# Patient Record
Sex: Female | Born: 1935 | Race: White | Hispanic: No | Smoking: Former smoker
Health system: Southern US, Community
[De-identification: ages and names within clinical notes are randomized; demographics above are authoritative.]

## PROBLEM LIST (undated history)

## (undated) DIAGNOSIS — E039 Hypothyroidism, unspecified: Secondary | ICD-10-CM

## (undated) DIAGNOSIS — K5790 Diverticulosis of intestine, part unspecified, without perforation or abscess without bleeding: Secondary | ICD-10-CM

## (undated) DIAGNOSIS — M1990 Unspecified osteoarthritis, unspecified site: Secondary | ICD-10-CM

## (undated) DIAGNOSIS — T4145XA Adverse effect of unspecified anesthetic, initial encounter: Secondary | ICD-10-CM

## (undated) DIAGNOSIS — H01003 Unspecified blepharitis right eye, unspecified eyelid: Secondary | ICD-10-CM

## (undated) DIAGNOSIS — R51 Headache: Secondary | ICD-10-CM

## (undated) DIAGNOSIS — H01006 Unspecified blepharitis left eye, unspecified eyelid: Secondary | ICD-10-CM

## (undated) DIAGNOSIS — F329 Major depressive disorder, single episode, unspecified: Secondary | ICD-10-CM

## (undated) DIAGNOSIS — G43909 Migraine, unspecified, not intractable, without status migrainosus: Secondary | ICD-10-CM

## (undated) DIAGNOSIS — F32A Depression, unspecified: Secondary | ICD-10-CM

## (undated) DIAGNOSIS — I10 Essential (primary) hypertension: Secondary | ICD-10-CM

## (undated) DIAGNOSIS — K219 Gastro-esophageal reflux disease without esophagitis: Secondary | ICD-10-CM

## (undated) DIAGNOSIS — R519 Headache, unspecified: Secondary | ICD-10-CM

## (undated) DIAGNOSIS — T8859XA Other complications of anesthesia, initial encounter: Secondary | ICD-10-CM

## (undated) HISTORY — PX: CATARACT EXTRACTION: SUR2

## (undated) HISTORY — PX: ANKLE SURGERY: SHX546

## (undated) HISTORY — PX: FOOT SURGERY: SHX648

## (undated) HISTORY — PX: SHOULDER SURGERY: SHX246

## (undated) HISTORY — PX: COLONOSCOPY: SHX174

## (undated) HISTORY — PX: CHOLECYSTECTOMY: SHX55

## (undated) HISTORY — PX: TOTAL KNEE ARTHROPLASTY: SHX125

## (undated) HISTORY — PX: BREAST SURGERY: SHX581

## (undated) HISTORY — PX: TONSILLECTOMY: SUR1361

## (undated) HISTORY — PX: OTHER SURGICAL HISTORY: SHX169

## (undated) HISTORY — PX: ABDOMINAL HYSTERECTOMY: SHX81

---

## 2001-10-20 ENCOUNTER — Other Ambulatory Visit: Admission: RE | Admit: 2001-10-20 | Discharge: 2001-10-20 | Payer: Self-pay | Admitting: Obstetrics & Gynecology

## 2002-05-24 ENCOUNTER — Encounter: Payer: Self-pay | Admitting: Orthopaedic Surgery

## 2002-05-24 ENCOUNTER — Ambulatory Visit (HOSPITAL_COMMUNITY): Admission: RE | Admit: 2002-05-24 | Discharge: 2002-05-24 | Payer: Self-pay | Admitting: Orthopaedic Surgery

## 2016-04-23 ENCOUNTER — Other Ambulatory Visit: Payer: Self-pay | Admitting: Orthopedic Surgery

## 2016-05-16 NOTE — Pre-Procedure Instructions (Signed)
Uchechi Metoyer  05/16/2016      [facility] [place] - [address] [place] [address] Phone: [phone] Fax: [phone]    Your procedure is scheduled on Monday,June 26   [facility] Admitting at 625 A.M.   Call this number if you have problems the morning of surgery:  [phone]   Remember:  Do not eat food or drink liquids after midnight.   Take these medicines the morning of surgery with A SIP OF WATER Bupropion (Wellbutrin XL), escitalopram (Lexapro), est estrogens-methyltest HS, Oxycodone (Percocet) if needed, Propranolol (Inderal), surcralfate (Carafate), Synthroid  7 days prior to surgery STOP taking (June 19):  any Aspirin, Aleve, Naproxen, Ibuprofen, Motrin, Advil, Goody's, BC's, all herbal medications, fish oil, and all vitamins    Do not wear jewelry, make-up or nail polish.  Do not wear lotions, powders, or perfumes.  You may wear deoderant.  Do not shave 48 hours prior to surgery.  Men may shave face and neck.  Do not bring valuables to the hospital.  [facility] is not responsible for any belongings or valuables.  Contacts, dentures or bridgework may not be worn into surgery.  Leave your suitcase in the car.  After surgery it may be brought to your room.  For patients admitted to the hospital, discharge time will be determined by your treatment team.  Patients discharged the day of surgery will not be allowed to drive home.    Special instructions:  [facility] - Preparing for Surgery  Before surgery, you can play an important role.  Because skin is not sterile, your skin needs to be as free of germs as possible.  You can reduce the number of germs on you skin by washing with CHG (chlorahexidine gluconate) soap before surgery.  CHG is an antiseptic cleaner which kills germs and bonds with the skin to continue killing germs even after washing.  Please DO NOT use if you have an allergy to CHG or  antibacterial soaps.  If your skin becomes reddened/irritated stop using the CHG and inform your nurse when you arrive at Short Stay.  Do not shave (including legs and underarms) for at least 48 hours prior to the first CHG shower.  You may shave your face.  Please follow these instructions carefully:   1.  Shower with CHG Soap the night before surgery and the                                morning of Surgery.  2.  If you choose to wash your hair, wash your hair first as usual with your       normal shampoo.  3.  After you shampoo, rinse your hair and body thoroughly to remove the                      Shampoo.  4.  Use CHG as you would any other liquid soap.  You can apply chg directly       to the skin and wash gently with scrungie or a clean washcloth.  5.  Apply the CHG Soap to your body ONLY FROM THE NECK DOWN.        Do not use on open wounds or open sores.  Avoid contact with your eyes,       ears, mouth and genitals (  private parts).  Wash genitals (private parts)       with your normal soap.  6.  Wash thoroughly, paying special attention to the area where your surgery        will be performed.  7.  Thoroughly rinse your body with warm water from the neck down.  8.  DO NOT shower/wash with your normal soap after using and rinsing off       the CHG Soap.  9.  Pat yourself dry with a clean towel.            10.  Wear clean pajamas.            11.  Place clean sheets on your bed the night of your first shower and do not        sleep with pets.  Day of Surgery  Do not apply any lotions/deoderants the morning of surgery.  Please wear clean clothes to the hospital/surgery center.     Please read over the following fact sheets that you were given. Pain Booklet, Coughing and Deep Breathing, MRSA Information and Surgical Site Infection Prevention, Incentive Spirometry

## 2016-05-17 ENCOUNTER — Other Ambulatory Visit: Payer: Self-pay

## 2016-05-17 ENCOUNTER — Encounter (HOSPITAL_COMMUNITY)
Admission: RE | Admit: 2016-05-17 | Discharge: 2016-05-17 | Disposition: A | Discharge status: Home / Self Care | Payer: Medicare Other | Source: Ambulatory Visit | Attending: Orthopedic Surgery | Admitting: Orthopedic Surgery

## 2016-05-17 ENCOUNTER — Encounter (HOSPITAL_COMMUNITY): Payer: Self-pay

## 2016-05-17 DIAGNOSIS — Z01812 Encounter for preprocedural laboratory examination: Secondary | ICD-10-CM | POA: Insufficient documentation

## 2016-05-17 DIAGNOSIS — M1712 Unilateral primary osteoarthritis, left knee: Secondary | ICD-10-CM | POA: Diagnosis not present

## 2016-05-17 DIAGNOSIS — I440 Atrioventricular block, first degree: Secondary | ICD-10-CM | POA: Diagnosis not present

## 2016-05-17 DIAGNOSIS — Z01818 Encounter for other preprocedural examination: Secondary | ICD-10-CM | POA: Diagnosis not present

## 2016-05-17 HISTORY — DX: Unspecified osteoarthritis, unspecified site: M19.90

## 2016-05-17 HISTORY — DX: Depression, unspecified: F32.A

## 2016-05-17 HISTORY — DX: Headache: R51

## 2016-05-17 HISTORY — DX: Gastro-esophageal reflux disease without esophagitis: K21.9

## 2016-05-17 HISTORY — DX: Adverse effect of unspecified anesthetic, initial encounter: T41.45XA

## 2016-05-17 HISTORY — DX: Major depressive disorder, single episode, unspecified: F32.9

## 2016-05-17 HISTORY — DX: Hypothyroidism, unspecified: E03.9

## 2016-05-17 HISTORY — DX: Migraine, unspecified, not intractable, without status migrainosus: G43.909

## 2016-05-17 HISTORY — DX: Headache, unspecified: R51.9

## 2016-05-17 HISTORY — DX: Diverticulosis of intestine, part unspecified, without perforation or abscess without bleeding: K57.90

## 2016-05-17 HISTORY — DX: Essential (primary) hypertension: I10

## 2016-05-17 HISTORY — DX: Other complications of anesthesia, initial encounter: T88.59XA

## 2016-05-17 LAB — CBC WITH DIFFERENTIAL/PLATELET
BASOS PCT: 1 %
Basophils Absolute: 0 10*3/uL (ref 0.0–0.1)
Eosinophils Absolute: 0.1 10*3/uL (ref 0.0–0.7)
Eosinophils Relative: 3 %
HCT: 38 % (ref 36.0–46.0)
Hemoglobin: 12.4 g/dL (ref 12.0–15.0)
LYMPHS ABS: 1.5 10*3/uL (ref 0.7–4.0)
LYMPHS PCT: 27 %
MCH: 30.5 pg (ref 26.0–34.0)
MCHC: 32.6 g/dL (ref 30.0–36.0)
MCV: 93.6 fL (ref 78.0–100.0)
MONO ABS: 0.5 10*3/uL (ref 0.1–1.0)
Monocytes Relative: 9 %
NEUTROS ABS: 3.4 10*3/uL (ref 1.7–7.7)
NEUTROS PCT: 60 %
PLATELETS: 235 10*3/uL (ref 150–400)
RBC: 4.06 MIL/uL (ref 3.87–5.11)
RDW: 12.3 % (ref 11.5–15.5)
WBC: 5.5 10*3/uL (ref 4.0–10.5)

## 2016-05-17 LAB — URINE MICROSCOPIC-ADD ON

## 2016-05-17 LAB — COMPREHENSIVE METABOLIC PANEL
ALT: 23 U/L (ref 14–54)
ANION GAP: 3 — AB (ref 5–15)
AST: 21 U/L (ref 15–41)
Albumin: 3.4 g/dL — ABNORMAL LOW (ref 3.5–5.0)
Alkaline Phosphatase: 66 U/L (ref 38–126)
BUN: 10 mg/dL (ref 6–20)
CHLORIDE: 100 mmol/L — AB (ref 101–111)
CO2: 31 mmol/L (ref 22–32)
CREATININE: 0.97 mg/dL (ref 0.44–1.00)
Calcium: 9 mg/dL (ref 8.9–10.3)
GFR calc non Af Amer: 54 mL/min — ABNORMAL LOW (ref >60)
Glucose, Bld: 97 mg/dL (ref 65–99)
Potassium: 4.4 mmol/L (ref 3.5–5.1)
SODIUM: 134 mmol/L — AB (ref 135–145)
Total Bilirubin: 0.4 mg/dL (ref 0.3–1.2)
Total Protein: 5.9 g/dL — ABNORMAL LOW (ref 6.5–8.1)

## 2016-05-17 LAB — URINALYSIS, ROUTINE W REFLEX MICROSCOPIC
Glucose, UA: NEGATIVE mg/dL
Ketones, ur: NEGATIVE mg/dL
LEUKOCYTES UA: NEGATIVE
Nitrite: NEGATIVE
PROTEIN: NEGATIVE mg/dL
Specific Gravity, Urine: 1.022 (ref 1.005–1.030)
pH: 6 (ref 5.0–8.0)

## 2016-05-17 LAB — PROTIME-INR
INR: 1.02 (ref 0.00–1.49)
Prothrombin Time: 13.6 seconds (ref 11.6–15.2)

## 2016-05-17 LAB — SURGICAL PCR SCREEN
MRSA, PCR: NEGATIVE
STAPHYLOCOCCUS AUREUS: NEGATIVE

## 2016-05-17 LAB — APTT: aPTT: 28 seconds (ref 24–37)

## 2016-05-17 NOTE — Progress Notes (Addendum)
PCP: Sheran SpineBriana Acero, PA No cardiologist, no echo, stress or cath EKG: 05/17/16, per pt no previous EKG to compare to Chest xray: 05/17/16  Pt with MRI scheduled for 05/20/16 with cognitive testing on 05/22/16 per patient. Pt states she is being worked up for dementia/alzheimer's prior to surgery. Pt and pt daughter instructed to call surgeon's office with any results if needed and to speak to anesthesia day of surgery as well. Pt and pt daughter verbalized understanding.   Pt with no complaints of cough, shortness of breath, fever, or chest pain.

## 2016-05-18 LAB — URINE CULTURE: Culture: <10000 — AB

## 2016-05-24 MED ORDER — SODIUM CHLORIDE 0.9 % IV SOLN: INTRAVENOUS | Status: DC

## 2016-05-24 MED ORDER — BUPIVACAINE LIPOSOME 1.3 % IJ SUSP
20.0000 mL | INTRAMUSCULAR | Status: AC
  Administered 2016-05-27: 20 mL
  Filled 2016-05-24: qty 20

## 2016-05-24 MED ORDER — TRANEXAMIC ACID 1000 MG/10ML IV SOLN
1000.0000 mg | INTRAVENOUS | Status: AC
  Administered 2016-05-27: 1000 mg via INTRAVENOUS
  Filled 2016-05-24: qty 10

## 2016-05-24 MED ORDER — ACETAMINOPHEN 500 MG PO TABS
1000.0000 mg | ORAL_TABLET | Freq: Once | ORAL | Status: AC
  Administered 2016-05-27: 1000 mg via ORAL
  Filled 2016-05-24: qty 2

## 2016-05-24 MED ORDER — CEFAZOLIN SODIUM-DEXTROSE 2-4 GM/100ML-% IV SOLN
2.0000 g | INTRAVENOUS | Status: AC
  Administered 2016-05-27: 2 g via INTRAVENOUS
  Filled 2016-05-24: qty 100

## 2016-05-27 ENCOUNTER — Inpatient Hospital Stay (HOSPITAL_COMMUNITY): Payer: Medicare Other | Admitting: Anesthesiology

## 2016-05-27 ENCOUNTER — Encounter (HOSPITAL_COMMUNITY): Payer: Self-pay | Admitting: General Practice

## 2016-05-27 ENCOUNTER — Encounter (HOSPITAL_COMMUNITY)
Admission: RE | Disposition: A | Discharge status: Home Health | Payer: Self-pay | Source: Ambulatory Visit | Attending: Orthopedic Surgery

## 2016-05-27 ENCOUNTER — Inpatient Hospital Stay (HOSPITAL_COMMUNITY): Payer: Medicare Other | Admitting: Emergency Medicine

## 2016-05-27 ENCOUNTER — Inpatient Hospital Stay (HOSPITAL_COMMUNITY)
Admission: RE | Admit: 2016-05-27 | Discharge: 2016-05-29 | DRG: 470 | Disposition: A | Discharge status: Home Health | Payer: Medicare Other | Source: Ambulatory Visit | Attending: Orthopedic Surgery | Admitting: Orthopedic Surgery

## 2016-05-27 DIAGNOSIS — K219 Gastro-esophageal reflux disease without esophagitis: Secondary | ICD-10-CM | POA: Diagnosis present

## 2016-05-27 DIAGNOSIS — Z91011 Allergy to milk products: Secondary | ICD-10-CM | POA: Diagnosis not present

## 2016-05-27 DIAGNOSIS — Z96651 Presence of right artificial knee joint: Secondary | ICD-10-CM | POA: Diagnosis present

## 2016-05-27 DIAGNOSIS — I10 Essential (primary) hypertension: Secondary | ICD-10-CM | POA: Diagnosis present

## 2016-05-27 DIAGNOSIS — M1712 Unilateral primary osteoarthritis, left knee: Secondary | ICD-10-CM | POA: Diagnosis present

## 2016-05-27 DIAGNOSIS — F329 Major depressive disorder, single episode, unspecified: Secondary | ICD-10-CM | POA: Diagnosis present

## 2016-05-27 DIAGNOSIS — Z96659 Presence of unspecified artificial knee joint: Secondary | ICD-10-CM

## 2016-05-27 DIAGNOSIS — D62 Acute posthemorrhagic anemia: Secondary | ICD-10-CM | POA: Diagnosis not present

## 2016-05-27 DIAGNOSIS — Z90710 Acquired absence of both cervix and uterus: Secondary | ICD-10-CM

## 2016-05-27 DIAGNOSIS — E039 Hypothyroidism, unspecified: Secondary | ICD-10-CM | POA: Diagnosis present

## 2016-05-27 DIAGNOSIS — Z91018 Allergy to other foods: Secondary | ICD-10-CM | POA: Diagnosis not present

## 2016-05-27 DIAGNOSIS — Z87891 Personal history of nicotine dependence: Secondary | ICD-10-CM | POA: Diagnosis not present

## 2016-05-27 DIAGNOSIS — Z9841 Cataract extraction status, right eye: Secondary | ICD-10-CM

## 2016-05-27 DIAGNOSIS — Z79899 Other long term (current) drug therapy: Secondary | ICD-10-CM | POA: Diagnosis not present

## 2016-05-27 DIAGNOSIS — Z9842 Cataract extraction status, left eye: Secondary | ICD-10-CM | POA: Diagnosis not present

## 2016-05-27 DIAGNOSIS — Z91041 Radiographic dye allergy status: Secondary | ICD-10-CM | POA: Diagnosis not present

## 2016-05-27 HISTORY — DX: Unspecified blepharitis left eye, unspecified eyelid: H01.006

## 2016-05-27 HISTORY — DX: Unspecified blepharitis right eye, unspecified eyelid: H01.003

## 2016-05-27 HISTORY — PX: TOTAL KNEE ARTHROPLASTY: SHX125

## 2016-05-27 SURGERY — ARTHROPLASTY, KNEE, TOTAL
Anesthesia: Spinal | Laterality: Left

## 2016-05-27 MED ORDER — BUPIVACAINE IN DEXTROSE 0.75-8.25 % IT SOLN
INTRATHECAL | Status: DC | PRN
  Administered 2016-05-27: 2 mL via INTRATHECAL

## 2016-05-27 MED ORDER — FENTANYL CITRATE (PF) 250 MCG/5ML IJ SOLN
INTRAMUSCULAR | Status: DC | PRN
  Administered 2016-05-27: 50 ug via INTRAVENOUS

## 2016-05-27 MED ORDER — DIPHENHYDRAMINE HCL 12.5 MG/5ML PO ELIX: 12.5000 mg | ORAL_SOLUTION | ORAL | Status: DC | PRN

## 2016-05-27 MED ORDER — ALUM & MAG HYDROXIDE-SIMETH 200-200-20 MG/5ML PO SUSP: 30.0000 mL | ORAL | Status: DC | PRN

## 2016-05-27 MED ORDER — EST ESTROGENS-METHYLTEST 0.625-1.25 MG PO TABS: 1.0000 | ORAL_TABLET | Freq: Every day | ORAL | Status: DC

## 2016-05-27 MED ORDER — EPHEDRINE SULFATE 50 MG/ML IJ SOLN
INTRAMUSCULAR | Status: DC | PRN
  Administered 2016-05-27 (×2): 10 mg via INTRAVENOUS
  Administered 2016-05-27 (×2): 5 mg via INTRAVENOUS

## 2016-05-27 MED ORDER — SENNOSIDES-DOCUSATE SODIUM 8.6-50 MG PO TABS: 1.0000 | ORAL_TABLET | Freq: Every evening | ORAL | Status: DC | PRN

## 2016-05-27 MED ORDER — MIDAZOLAM HCL 5 MG/5ML IJ SOLN
INTRAMUSCULAR | Status: DC | PRN
  Administered 2016-05-27: 2 mg via INTRAVENOUS

## 2016-05-27 MED ORDER — BUPROPION HCL ER (XL) 150 MG PO TB24
300.0000 mg | ORAL_TABLET | Freq: Every day | ORAL | Status: DC
  Administered 2016-05-28 – 2016-05-29 (×2): 300 mg via ORAL
  Filled 2016-05-27 (×2): qty 2

## 2016-05-27 MED ORDER — HYDROCHLOROTHIAZIDE 12.5 MG PO CAPS
12.5000 mg | ORAL_CAPSULE | Freq: Every day | ORAL | Status: DC
  Administered 2016-05-28 – 2016-05-29 (×2): 12.5 mg via ORAL
  Filled 2016-05-27 (×2): qty 1

## 2016-05-27 MED ORDER — ACETAMINOPHEN 650 MG RE SUPP: 650.0000 mg | Freq: Four times a day (QID) | RECTAL | Status: DC | PRN

## 2016-05-27 MED ORDER — PROPOFOL 10 MG/ML IV BOLUS
INTRAVENOUS | Status: AC
  Filled 2016-05-27: qty 20

## 2016-05-27 MED ORDER — CHLORHEXIDINE GLUCONATE 4 % EX LIQD: 60.0000 mL | Freq: Once | CUTANEOUS | Status: DC

## 2016-05-27 MED ORDER — METHOCARBAMOL 500 MG PO TABS
500.0000 mg | ORAL_TABLET | Freq: Four times a day (QID) | ORAL | Status: DC | PRN
  Administered 2016-05-27 – 2016-05-29 (×5): 500 mg via ORAL
  Filled 2016-05-27 (×5): qty 1

## 2016-05-27 MED ORDER — PROPRANOLOL HCL 10 MG PO TABS
10.0000 mg | ORAL_TABLET | Freq: Every day | ORAL | Status: DC
  Administered 2016-05-28 – 2016-05-29 (×2): 10 mg via ORAL
  Filled 2016-05-27 (×2): qty 1

## 2016-05-27 MED ORDER — ESCITALOPRAM OXALATE 10 MG PO TABS
20.0000 mg | ORAL_TABLET | Freq: Every day | ORAL | Status: DC
  Administered 2016-05-28 – 2016-05-29 (×2): 20 mg via ORAL
  Filled 2016-05-27 (×2): qty 2

## 2016-05-27 MED ORDER — LEVOTHYROXINE SODIUM 25 MCG PO TABS
25.0000 ug | ORAL_TABLET | Freq: Every day | ORAL | Status: DC
  Administered 2016-05-28 – 2016-05-29 (×3): 25 ug via ORAL
  Filled 2016-05-27 (×2): qty 1

## 2016-05-27 MED ORDER — SODIUM CHLORIDE 0.9 % IR SOLN
Status: DC | PRN
Start: 2016-05-27 — End: 2016-05-27
  Administered 2016-05-27 (×2): 1000 mL

## 2016-05-27 MED ORDER — BUPIVACAINE-EPINEPHRINE (PF) 0.25% -1:200000 IJ SOLN
INTRAMUSCULAR | Status: DC | PRN
  Administered 2016-05-27: 20 mL via PERINEURAL

## 2016-05-27 MED ORDER — MIDAZOLAM HCL 2 MG/2ML IJ SOLN
INTRAMUSCULAR | Status: AC
  Filled 2016-05-27: qty 2

## 2016-05-27 MED ORDER — ZOLPIDEM TARTRATE 5 MG PO TABS
5.0000 mg | ORAL_TABLET | Freq: Every evening | ORAL | Status: DC | PRN
Start: 2016-05-27 — End: 2016-05-29

## 2016-05-27 MED ORDER — CEFAZOLIN IN D5W 1 GM/50ML IV SOLN
1.0000 g | Freq: Four times a day (QID) | INTRAVENOUS | Status: AC
  Administered 2016-05-27 (×2): 1 g via INTRAVENOUS
  Filled 2016-05-27 (×2): qty 50

## 2016-05-27 MED ORDER — PHENYLEPHRINE 40 MCG/ML (10ML) SYRINGE FOR IV PUSH (FOR BLOOD PRESSURE SUPPORT)
PREFILLED_SYRINGE | INTRAVENOUS | Status: AC
  Filled 2016-05-27: qty 10

## 2016-05-27 MED ORDER — SODIUM CHLORIDE 0.9 % IJ SOLN
INTRAMUSCULAR | Status: DC | PRN
  Administered 2016-05-27: 20 mL via INTRAVENOUS

## 2016-05-27 MED ORDER — HYDROMORPHONE HCL 1 MG/ML IJ SOLN
INTRAMUSCULAR | Status: AC
  Filled 2016-05-27: qty 1

## 2016-05-27 MED ORDER — HYDROMORPHONE HCL 1 MG/ML IJ SOLN
1.0000 mg | INTRAMUSCULAR | Status: DC | PRN
  Administered 2016-05-27 – 2016-05-28 (×7): 1 mg via INTRAVENOUS
  Filled 2016-05-27 (×7): qty 1

## 2016-05-27 MED ORDER — MEPERIDINE HCL 25 MG/ML IJ SOLN: 6.2500 mg | INTRAMUSCULAR | Status: DC | PRN

## 2016-05-27 MED ORDER — METOCLOPRAMIDE HCL 5 MG PO TABS: 5.0000 mg | ORAL_TABLET | Freq: Three times a day (TID) | ORAL | Status: DC | PRN

## 2016-05-27 MED ORDER — ERYTHROMYCIN 5 MG/GM OP OINT
1.0000 "application " | TOPICAL_OINTMENT | Freq: Every day | OPHTHALMIC | Status: DC
  Administered 2016-05-27 – 2016-05-28 (×2): 1 via OPHTHALMIC
  Filled 2016-05-27: qty 3.5

## 2016-05-27 MED ORDER — BISACODYL 5 MG PO TBEC: 5.0000 mg | DELAYED_RELEASE_TABLET | Freq: Every day | ORAL | Status: DC | PRN

## 2016-05-27 MED ORDER — ONDANSETRON HCL 4 MG/2ML IJ SOLN
4.0000 mg | Freq: Four times a day (QID) | INTRAMUSCULAR | Status: DC | PRN
  Administered 2016-05-27 – 2016-05-28 (×2): 4 mg via INTRAVENOUS
  Filled 2016-05-27 (×2): qty 2

## 2016-05-27 MED ORDER — ACETAMINOPHEN 325 MG PO TABS: 650.0000 mg | ORAL_TABLET | Freq: Four times a day (QID) | ORAL | Status: DC | PRN

## 2016-05-27 MED ORDER — BUPIVACAINE-EPINEPHRINE (PF) 0.25% -1:200000 IJ SOLN
INTRAMUSCULAR | Status: AC
  Filled 2016-05-27: qty 30

## 2016-05-27 MED ORDER — DEXTROSE 5 % IV SOLN
500.0000 mg | Freq: Four times a day (QID) | INTRAVENOUS | Status: DC | PRN
  Filled 2016-05-27: qty 5

## 2016-05-27 MED ORDER — DEXAMETHASONE SODIUM PHOSPHATE 10 MG/ML IJ SOLN
10.0000 mg | Freq: Once | INTRAMUSCULAR | Status: AC
  Administered 2016-05-28: 10 mg via INTRAVENOUS
  Filled 2016-05-27: qty 1

## 2016-05-27 MED ORDER — OXYCODONE HCL 5 MG PO TABS
5.0000 mg | ORAL_TABLET | ORAL | Status: DC | PRN
  Administered 2016-05-27 – 2016-05-29 (×9): 10 mg via ORAL
  Filled 2016-05-27 (×11): qty 2

## 2016-05-27 MED ORDER — MENTHOL 3 MG MT LOZG: 1.0000 | LOZENGE | OROMUCOSAL | Status: DC | PRN

## 2016-05-27 MED ORDER — FENTANYL CITRATE (PF) 250 MCG/5ML IJ SOLN
INTRAMUSCULAR | Status: AC
  Filled 2016-05-27: qty 5

## 2016-05-27 MED ORDER — EPHEDRINE 5 MG/ML INJ
INTRAVENOUS | Status: AC
  Filled 2016-05-27: qty 10

## 2016-05-27 MED ORDER — ONDANSETRON HCL 4 MG PO TABS: 4.0000 mg | ORAL_TABLET | Freq: Four times a day (QID) | ORAL | Status: DC | PRN

## 2016-05-27 MED ORDER — DOCUSATE SODIUM 100 MG PO CAPS
100.0000 mg | ORAL_CAPSULE | Freq: Two times a day (BID) | ORAL | Status: DC
  Administered 2016-05-27 – 2016-05-29 (×4): 100 mg via ORAL
  Filled 2016-05-27 (×4): qty 1

## 2016-05-27 MED ORDER — ONDANSETRON HCL 4 MG/2ML IJ SOLN: 4.0000 mg | Freq: Once | INTRAMUSCULAR | Status: DC | PRN

## 2016-05-27 MED ORDER — OXYCODONE HCL ER 10 MG PO T12A
10.0000 mg | EXTENDED_RELEASE_TABLET | Freq: Two times a day (BID) | ORAL | Status: DC
  Administered 2016-05-27 – 2016-05-29 (×5): 10 mg via ORAL
  Filled 2016-05-27 (×5): qty 1

## 2016-05-27 MED ORDER — HYDROMORPHONE HCL 1 MG/ML IJ SOLN
0.2500 mg | INTRAMUSCULAR | Status: DC | PRN
  Administered 2016-05-27 (×2): 0.5 mg via INTRAVENOUS

## 2016-05-27 MED ORDER — ASPIRIN EC 325 MG PO TBEC
325.0000 mg | DELAYED_RELEASE_TABLET | Freq: Two times a day (BID) | ORAL | Status: DC
  Administered 2016-05-27 – 2016-05-29 (×4): 325 mg via ORAL
  Filled 2016-05-27 (×4): qty 1

## 2016-05-27 MED ORDER — LOSARTAN POTASSIUM-HCTZ 50-12.5 MG PO TABS: 1.0000 | ORAL_TABLET | Freq: Every day | ORAL | Status: DC

## 2016-05-27 MED ORDER — TRANEXAMIC ACID 1000 MG/10ML IV SOLN
1000.0000 mg | Freq: Once | INTRAVENOUS | Status: AC
  Administered 2016-05-27: 1000 mg via INTRAVENOUS
  Filled 2016-05-27: qty 10

## 2016-05-27 MED ORDER — LACTATED RINGERS IV SOLN
INTRAVENOUS | Status: DC
  Administered 2016-05-27 (×2): via INTRAVENOUS

## 2016-05-27 MED ORDER — PHENOL 1.4 % MT LIQD: 1.0000 | OROMUCOSAL | Status: DC | PRN

## 2016-05-27 MED ORDER — SODIUM CHLORIDE 0.9 % IV SOLN
INTRAVENOUS | Status: DC
  Administered 2016-05-27: 13:00:00 via INTRAVENOUS

## 2016-05-27 MED ORDER — PROPOFOL 10 MG/ML IV BOLUS
INTRAVENOUS | Status: DC | PRN
  Administered 2016-05-27: 20 mg via INTRAVENOUS

## 2016-05-27 MED ORDER — SUCRALFATE 1 G PO TABS
1.0000 g | ORAL_TABLET | Freq: Three times a day (TID) | ORAL | Status: DC
  Administered 2016-05-28 – 2016-05-29 (×4): 1 g via ORAL
  Filled 2016-05-27 (×5): qty 1

## 2016-05-27 MED ORDER — FLEET ENEMA 7-19 GM/118ML RE ENEM: 1.0000 | ENEMA | Freq: Once | RECTAL | Status: DC | PRN

## 2016-05-27 MED ORDER — LOSARTAN POTASSIUM 50 MG PO TABS
50.0000 mg | ORAL_TABLET | Freq: Every day | ORAL | Status: DC
  Administered 2016-05-28 – 2016-05-29 (×2): 50 mg via ORAL
  Filled 2016-05-27 (×2): qty 1

## 2016-05-27 MED ORDER — PROPOFOL 500 MG/50ML IV EMUL
INTRAVENOUS | Status: DC | PRN
  Administered 2016-05-27: 25 ug/kg/min via INTRAVENOUS

## 2016-05-27 MED ORDER — METOCLOPRAMIDE HCL 5 MG/ML IJ SOLN: 5.0000 mg | Freq: Three times a day (TID) | INTRAMUSCULAR | Status: DC | PRN

## 2016-05-27 MED ORDER — ACETAMINOPHEN 500 MG PO TABS
1000.0000 mg | ORAL_TABLET | Freq: Four times a day (QID) | ORAL | Status: AC
Start: 2016-05-27 — End: 2016-05-28
  Administered 2016-05-27 (×3): 1000 mg via ORAL
  Filled 2016-05-27 (×4): qty 2

## 2016-05-27 SURGICAL SUPPLY — 59 items
BANDAGE ESMARK 6X9 LF (GAUZE/BANDAGES/DRESSINGS) ×1 IMPLANT
BLADE SAGITTAL 13X1.27X60 (BLADE) ×2 IMPLANT
BLADE SAW SGTL 83.5X18.5 (BLADE) ×2 IMPLANT
BLADE SURG 10 STRL SS (BLADE) ×2 IMPLANT
BNDG ESMARK 6X9 LF (GAUZE/BANDAGES/DRESSINGS) ×2
BOWL SMART MIX CTS (DISPOSABLE) ×2 IMPLANT
CAPT KNEE TOTAL 3 ×2 IMPLANT
CEMENT BONE SIMPLEX SPEEDSET (Cement) ×4 IMPLANT
COVER SURGICAL LIGHT HANDLE (MISCELLANEOUS) ×2 IMPLANT
CUFF TOURNIQUET SINGLE 34IN LL (TOURNIQUET CUFF) ×2 IMPLANT
DRAPE EXTREMITY T 121X128X90 (DRAPE) ×2 IMPLANT
DRAPE INCISE IOBAN 66X45 STRL (DRAPES) ×4 IMPLANT
DRAPE PROXIMA HALF (DRAPES) IMPLANT
DRAPE U-SHAPE 47X51 STRL (DRAPES) ×2 IMPLANT
DRSG ADAPTIC 3X8 NADH LF (GAUZE/BANDAGES/DRESSINGS) ×2 IMPLANT
DRSG PAD ABDOMINAL 8X10 ST (GAUZE/BANDAGES/DRESSINGS) ×2 IMPLANT
DURAPREP 26ML APPLICATOR (WOUND CARE) ×4 IMPLANT
ELECT REM PT RETURN 9FT ADLT (ELECTROSURGICAL) ×2
ELECTRODE REM PT RTRN 9FT ADLT (ELECTROSURGICAL) ×1 IMPLANT
GAUZE SPONGE 4X4 12PLY STRL (GAUZE/BANDAGES/DRESSINGS) ×2 IMPLANT
GLOVE BIOGEL M 7.0 STRL (GLOVE) ×2 IMPLANT
GLOVE BIOGEL PI IND STRL 7.5 (GLOVE) ×1 IMPLANT
GLOVE BIOGEL PI IND STRL 8.5 (GLOVE) ×2 IMPLANT
GLOVE BIOGEL PI INDICATOR 7.5 (GLOVE) ×1
GLOVE BIOGEL PI INDICATOR 8.5 (GLOVE) ×2
GLOVE SURG ORTHO 8.0 STRL STRW (GLOVE) ×4 IMPLANT
GOWN STRL REUS W/ TWL LRG LVL3 (GOWN DISPOSABLE) ×1 IMPLANT
GOWN STRL REUS W/ TWL XL LVL3 (GOWN DISPOSABLE) ×2 IMPLANT
GOWN STRL REUS W/TWL 2XL LVL3 (GOWN DISPOSABLE) ×2 IMPLANT
GOWN STRL REUS W/TWL LRG LVL3 (GOWN DISPOSABLE) ×1
GOWN STRL REUS W/TWL XL LVL3 (GOWN DISPOSABLE) ×2
HANDPIECE INTERPULSE COAX TIP (DISPOSABLE) ×1
HOOD PEEL AWAY FACE SHEILD DIS (HOOD) ×6 IMPLANT
KIT BASIN OR (CUSTOM PROCEDURE TRAY) ×2 IMPLANT
KIT ROOM TURNOVER OR (KITS) ×2 IMPLANT
KNEE CAPITATED TOTAL 3 ×1 IMPLANT
MANIFOLD NEPTUNE II (INSTRUMENTS) ×2 IMPLANT
NEEDLE 22X1 1/2 (OR ONLY) (NEEDLE) ×4 IMPLANT
NS IRRIG 1000ML POUR BTL (IV SOLUTION) ×2 IMPLANT
PACK TOTAL JOINT (CUSTOM PROCEDURE TRAY) ×2 IMPLANT
PACK UNIVERSAL I (CUSTOM PROCEDURE TRAY) ×2 IMPLANT
PAD ARMBOARD 7.5X6 YLW CONV (MISCELLANEOUS) ×4 IMPLANT
PADDING CAST COTTON 6X4 STRL (CAST SUPPLIES) ×2 IMPLANT
SET HNDPC FAN SPRY TIP SCT (DISPOSABLE) ×1 IMPLANT
SMARTMIX MINI TOWER (MISCELLANEOUS)
STAPLER VISISTAT 35W (STAPLE) ×2 IMPLANT
SUCTION FRAZIER HANDLE 10FR (MISCELLANEOUS) ×1
SUCTION TUBE FRAZIER 10FR DISP (MISCELLANEOUS) ×1 IMPLANT
SUT BONE WAX W31G (SUTURE) ×2 IMPLANT
SUT VIC AB 0 CTB1 27 (SUTURE) ×4 IMPLANT
SUT VIC AB 1 CT1 27 (SUTURE) ×2
SUT VIC AB 1 CT1 27XBRD ANBCTR (SUTURE) ×2 IMPLANT
SUT VIC AB 2-0 CT1 27 (SUTURE) ×2
SUT VIC AB 2-0 CT1 TAPERPNT 27 (SUTURE) ×2 IMPLANT
SYR 20CC LL (SYRINGE) ×4 IMPLANT
TOWEL OR 17X24 6PK STRL BLUE (TOWEL DISPOSABLE) ×2 IMPLANT
TOWEL OR 17X26 10 PK STRL BLUE (TOWEL DISPOSABLE) ×2 IMPLANT
TOWER SMARTMIX MINI (MISCELLANEOUS) IMPLANT
WATER STERILE IRR 1000ML POUR (IV SOLUTION) IMPLANT

## 2016-05-27 NOTE — Anesthesia Procedure Notes (Signed)
Spinal Patient location during procedure: OR Start time: 05/27/2016 8:40 AM End time: 05/27/2016 8:45 AM Staffing Anesthesiologist: Arta BruceSSEY, Janique Hoefer Performed by: anesthesiologist  Preanesthetic Checklist Completed: patient identified, site marked, surgical consent, pre-op evaluation, timeout performed, IV checked, risks and benefits discussed and monitors and equipment checked Spinal Block Patient position: sitting Prep: Betadine Patient monitoring: heart rate, cardiac monitor, continuous pulse ox and blood pressure Approach: right paramedian Location: L3-4 Injection technique: single-shot Needle Needle type: Pencan  Needle gauge: 24 G Needle length: 9 cm Needle insertion depth: 7 cm

## 2016-05-27 NOTE — Anesthesia Preprocedure Evaluation (Signed)
Anesthesia Evaluation  Patient identified by MRN, date of birth, ID band Patient awake    Reviewed: Allergy & Precautions, NPO status , Patient's Chart, lab work & pertinent test results  Airway Mallampati: I  TM Distance: >3 FB Neck ROM: Full    Dental   Pulmonary former smoker,    Pulmonary exam normal        Cardiovascular hypertension, Normal cardiovascular exam     Neuro/Psych Depression    GI/Hepatic   Endo/Other    Renal/GU      Musculoskeletal   Abdominal   Peds  Hematology   Anesthesia Other Findings   Reproductive/Obstetrics                             Anesthesia Physical Anesthesia Plan  ASA: II  Anesthesia Plan: Spinal   Post-op Pain Management:    Induction: Intravenous  Airway Management Planned: Natural Airway  Additional Equipment:   Intra-op Plan:   Post-operative Plan:   Informed Consent: I have reviewed the patients History and Physical, chart, labs and discussed the procedure including the risks, benefits and alternatives for the proposed anesthesia with the patient or authorized representative who has indicated his/her understanding and acceptance.     Plan Discussed with: CRNA and Surgeon  Anesthesia Plan Comments:         Anesthesia Quick Evaluation

## 2016-05-27 NOTE — Evaluation (Signed)
Physical Therapy Evaluation Patient Details Name: Rebecca Glenn MRN: [id number] DOB: 06/15/1935 Today's Date: 05/27/2016   History of Present Illness  Patient is a 81 y/o female with hx of HTN, hypothyroidism, depression presents s/p left TKA.   Clinical Impression  Patient presents with pain and post surgical deficits LLE s/p left TKA. Ambulation distance limited due to pain. Tolerated ambulating a few feet to chair with Min A for balance. Instructed pt in exercises. Pt plans to discharge home with support from family and neighbors. Will follow acutely to maximize independence and mobility prior to return home.    Follow Up Recommendations Home health PT;Supervision for mobility/OOB    Equipment Recommendations  None recommended by PT    Recommendations for Other Services OT consult     Precautions / Restrictions Precautions Precautions: Fall;Knee Precaution Booklet Issued: No Precaution Comments: Reviewed no pillow under knee and precautions. Restrictions Weight Bearing Restrictions: Yes LLE Weight Bearing: Weight bearing as tolerated      Mobility  Bed Mobility Overal bed mobility: Needs Assistance Bed Mobility: Supine to Sit     Supine to sit: Min guard;HOB elevated     General bed mobility comments: No assist needed. Use of rail. Increased time, + dizziness.  Transfers Overall transfer level: Needs assistance Equipment used: Rolling walker (2 wheeled) Transfers: Sit to/from Stand Sit to Stand: Min guard         General transfer comment: Min guard for safety. Stood from [employer] x1 with cues for hand placement/technique. Transferred to chair post ambulation bout.  Ambulation/Gait Ambulation/Gait assistance: Min assist Ambulation Distance (Feet): 5 Feet Assistive device: Rolling walker (2 wheeled) Gait Pattern/deviations: Step-to pattern;Decreased stance time - left;Decreased step length - right;Trunk flexed Gait velocity: decreased   General Gait Details: Slow,  unsteady gait with increased WB through BUEs to offload LLE.  Stairs            Wheelchair Mobility    Modified Rankin (Stroke Patients Only)       Balance Overall balance assessment: Needs assistance Sitting-balance support: Feet supported;No upper extremity supported Sitting balance-Leahy Scale: Fair     Standing balance support: During functional activity Standing balance-Leahy Scale: Poor Standing balance comment: Reliant on BUEs for support.                             Pertinent Vitals/Pain Pain Assessment: 0-10 Pain Score: 5  Pain Location: left knee Pain Descriptors / Indicators: Sore;Operative site guarding Pain Intervention(s): Monitored during session;Limited activity within patient's tolerance;Repositioned;Premedicated before session    Home Living Family/patient expects to be discharged to:: Private residence Living Arrangements: Alone;Children Available Help at Discharge: Family;Available 24 hours/day;Neighbor (family/neighbors will be providing 24/7 for a few weeks) Type of Home: House Home Access: Stairs to enter Entrance Stairs-Rails: None Entrance Stairs-Number of Steps: 2 Home Layout: One level Home Equipment: Walker - 2 wheels;Bedside commode;Wheelchair - manual;Cane - single point;Tub bench      Prior Function Level of Independence: Independent         Comments: Pt furniture walking at baseline due to pain.      Hand Dominance        Extremity/Trunk Assessment   Upper Extremity Assessment: Defer to OT evaluation           Lower Extremity Assessment: LLE deficits/detail   LLE Deficits / Details: Limited AROM/strength secondary to pain/surgery     Communication   Communication: No difficulties  Cognition Arousal/Alertness: Awake/alert  Behavior During Therapy: WFL for tasks assessed/performed Overall Cognitive Status: Within Functional Limits for tasks assessed                      General Comments  General comments (skin integrity, edema, etc.): Daughter present during session.    Exercises Total Joint Exercises Ankle Circles/Pumps: Both;15 reps;Supine Quad Sets: Both;10 reps;Supine Gluteal Sets: Both;10 reps;Supine      Assessment/Plan    PT Assessment Patient needs continued PT services  PT Diagnosis Difficulty walking;Acute pain   PT Problem List Decreased strength;Pain;Decreased range of motion;Decreased activity tolerance;Decreased balance;Decreased mobility;Decreased knowledge of precautions  PT Treatment Interventions Balance training;Gait training;Stair training;Functional mobility training;Therapeutic activities;Therapeutic exercise;Patient/family education;DME instruction   PT Goals (Current goals can be found in the Care Plan section) Acute Rehab PT Goals Patient Stated Goal: to ge tback to independence PT Goal Formulation: With patient Time For Goal Achievement: 06/10/16 Potential to Achieve Goals: Fair    Frequency 7X/week   Barriers to discharge Inaccessible home environment 2 steps to enter home    Co-evaluation               End of Session Equipment Utilized During Treatment: Gait belt Activity Tolerance: Patient limited by pain;Patient tolerated treatment well Patient left: in chair;with call bell/phone within reach;with family/visitor present Nurse Communication: Mobility status         Time: [id number] PT Time Calculation (min) (ACUTE ONLY): 28 min   Charges:   PT Evaluation $PT Eval Moderate Complexity: 1 Procedure PT Treatments $Gait Training: 8-22 mins   PT G Codes:        Keshav Winegar A Dorian Duval 05/27/2016, 3:54 PM Mylo RedShauna Frederich Montilla, PT, DPT [phone]

## 2016-05-27 NOTE — Progress Notes (Signed)
Orthopedic Tech Progress Note Patient Details:  Rebecca Glenn 01/08/1935 [id number]  CPM Left Knee CPM Left Knee: On Left Knee Flexion (Degrees): 90 Left Knee Extension (Degrees): 0 Additional Comments: Trapeze bar and foot roll   Saul FordyceJennifer C Jakala Herford 05/27/2016, 11:33 AM

## 2016-05-27 NOTE — Anesthesia Postprocedure Evaluation (Signed)
Anesthesia Post Note  Patient: Rebecca Glenn  Procedure(s) Performed: Procedure(s) (LRB): TOTAL KNEE ARTHROPLASTY (Left)  Patient location during evaluation: PACU Anesthesia Type: Spinal Level of consciousness: oriented and awake and alert Pain management: pain level controlled Vital Signs Assessment: post-procedure vital signs reviewed and stable Respiratory status: spontaneous breathing, respiratory function stable and patient connected to nasal cannula oxygen Cardiovascular status: blood pressure returned to baseline and stable Postop Assessment: no headache and no backache Anesthetic complications: no    Last Vitals:  Filed Vitals:   05/27/16 1227 05/27/16 1324  BP:  146/71  Pulse: 60 63  Temp:  36.1 C  Resp: 18 18    Last Pain:  Filed Vitals:   05/27/16 1326  PainSc: 10-Worst pain ever                 Delani Kohli DAVID

## 2016-05-27 NOTE — Transfer of Care (Signed)
Immediate Anesthesia Transfer of Care Note  Patient: Shavaun Pantano  Procedure(s) Performed: Procedure(s): TOTAL KNEE ARTHROPLASTY (Left)  Patient Location: PACU  Anesthesia Type:MAC and Spinal  Level of Consciousness: awake, alert , oriented and patient cooperative  Airway & Oxygen Therapy: Patient Spontanous Breathing and Patient connected to nasal cannula oxygen  Post-op Assessment: Report given to RN, Post -op Vital signs reviewed and stable and Patient moving all extremities  Post vital signs: Reviewed and stable  Last Vitals:  Filed Vitals:   05/27/16 0640 05/27/16 1110  BP: 131/55   Pulse: 63   Temp: 36.8 C 36.6 C  Resp: 18     Last Pain:  Filed Vitals:   05/27/16 1122  PainSc: 0-No pain      Patients Stated Pain Goal: 4 (05/27/16 0705)  Complications: No apparent anesthesia complications

## 2016-05-27 NOTE — H&P (Signed)
Noralee CharsWilma Stucke MRN:  [id number] DOB/SEX:  07/30/1935/female  CHIEF COMPLAINT:  Painful left Knee  HISTORY: Patient is a 81 y.o. female presented with a history of pain in the left knee. Onset of symptoms was gradual starting a few years ago with gradually worsening course since that time. Patient has been treated conservatively with over-the-counter NSAIDs and activity modification. Patient currently rates pain in the knee at 10 out of 10 with activity. There is pain at night.  PAST MEDICAL HISTORY: There are no active problems to display for this patient.  Past Medical History  Diagnosis Date  . Hypertension   . Hypothyroidism   . Depression   . GERD (gastroesophageal reflux disease)     used to take nexium, takes tums  . Headache   . Migraine     infrequent  . Arthritis     osteoarthritis  . Diverticulosis     on colonoscopy  . Complication of anesthesia     sever pain in lower back after spinal block 25 years ago   Past Surgical History  Procedure Laterality Date  . Cataract extraction Bilateral   . Shoulder surgery      arthritis in joint, removed part of bone?  Marland Kitchen. Abdominal hysterectomy    . Total knee arthroplasty Right   . Foot surgery Bilateral     foot fusion  . Tonsillectomy    . Colonoscopy    . Ankle surgery      tumor removed  . Spinal block      "long time ago"     MEDICATIONS:   Prescriptions prior to admission  Medication Sig Dispense Refill Last Dose  . buPROPion (WELLBUTRIN XL) 300 MG 24 hr tablet Take 1 tablet by mouth daily.     Marland Kitchen. escitalopram (LEXAPRO) 20 MG tablet Take 1 tablet by mouth daily.     Marland Kitchen. EST ESTROGENS-METHYLTEST HS 0.625-1.25 MG tablet Take 1 tablet by mouth daily.  4   . LORazepam (ATIVAN) 1 MG tablet TAKE 1 TABLET BY MOUTH ONE HOUR BEFORE MRI,THEN TAKE 2 TABLETS TO APPOINTMENT  0   . losartan-hydrochlorothiazide (HYZAAR) 50-12.5 MG tablet Take 0.5 tablets by mouth daily.  5   . OVER THE COUNTER MEDICATION Place 1 drop into both eyes 3  (three) times daily. Eye drop     . oxyCODONE-acetaminophen (PERCOCET) 10-325 MG tablet Take 1 tablet by mouth every 4 (four) hours as needed.  0   . propranolol (INDERAL) 10 MG tablet Take 10 mg by mouth daily.  1   . sucralfate (CARAFATE) 1 g tablet Take 1 tablet by mouth 3 (three) times daily as needed.  0   . SYNTHROID 25 MCG tablet Take 1 tablet by mouth daily.     . zaleplon (SONATA) 10 MG capsule Take 1 capsule by mouth daily as needed.  5     ALLERGIES:   Allergies  Allergen Reactions  . Cheese   . Ivp Dye [Iodinated Diagnostic Agents] Rash  . Milk-Related Compounds Rash    REVIEW OF SYSTEMS:  A comprehensive review of systems was negative except for: Musculoskeletal: positive for arthralgias and stiff joints   FAMILY HISTORY:  No family history on file.  SOCIAL HISTORY:   Social History  Substance Use Topics  . Smoking status: Former Games developermoker  . Smokeless tobacco: Not on file     Comment: quit smoking 40 years ago  . Alcohol Use: No     Comment: rarely     EXAMINATION:  Vital signs in last 24 hours:    There were no vitals taken for this visit.  General Appearance:    Alert, cooperative, no distress, appears stated age  Head:    Normocephalic, without obvious abnormality, atraumatic  Eyes:    PERRL, conjunctiva/corneas clear, EOM's intact, fundi    benign, both eyes  Ears:    Normal TM's and external ear canals, both ears  Nose:   Nares normal, septum midline, mucosa normal, no drainage    or sinus tenderness  Throat:   Lips, mucosa, and tongue normal; teeth and gums normal  Neck:   Supple, symmetrical, trachea midline, no adenopathy;    thyroid:  no enlargement/tenderness/nodules; no carotid   bruit or JVD  Back:     Symmetric, no curvature, ROM normal, no CVA tenderness  Lungs:     Clear to auscultation bilaterally, respirations unlabored  Chest Wall:    No tenderness or deformity   Heart:    Regular rate and rhythm, S1 and S2 normal, no murmur, rub   or  gallop  Breast Exam:    No tenderness, masses, or nipple abnormality  Abdomen:     Soft, non-tender, bowel sounds active all four quadrants,    no masses, no organomegaly  Genitalia:    Normal female without lesion, discharge or tenderness  Rectal:    Normal tone, no masses or tenderness;   guaiac negative stool  Extremities:   Extremities normal, atraumatic, no cyanosis or edema  Pulses:   2+ and symmetric all extremities  Skin:   Skin color, texture, turgor normal, no rashes or lesions  Lymph nodes:   Cervical, supraclavicular, and axillary nodes normal  Neurologic:   CNII-XII intact, normal strength, sensation and reflexes    throughout    Musculoskeletal:  ROM 0-120, Ligaments intact,  Imaging Review Plain radiographs demonstrate severe degenerative joint disease of the left knee. The overall alignment is neutral. The bone quality appears to be excellent for age and reported activity level.  Assessment/Plan: Primary osteoarthritis, left knee   The patient history, physical examination and imaging studies are consistent with advanced degenerative joint disease of the left knee. The patient has failed conservative treatment.  The clearance notes were reviewed.  After discussion with the patient it was felt that Total Knee Replacement was indicated. The procedure,  risks, and benefits of total knee arthroplasty were presented and reviewed. The risks including but not limited to aseptic loosening, infection, blood clots, vascular injury, stiffness, patella tracking problems complications among others were discussed. The patient acknowledged the explanation, agreed to proceed with the plan.  Guy SandiferColby Alan Robbins 05/27/2016, 6:28 AM

## 2016-05-28 ENCOUNTER — Encounter (HOSPITAL_COMMUNITY): Payer: Self-pay | Admitting: Orthopedic Surgery

## 2016-05-28 LAB — BASIC METABOLIC PANEL
ANION GAP: 6 (ref 5–15)
BUN: 5 mg/dL — ABNORMAL LOW (ref 6–20)
CO2: 31 mmol/L (ref 22–32)
Calcium: 8.2 mg/dL — ABNORMAL LOW (ref 8.9–10.3)
Chloride: 91 mmol/L — ABNORMAL LOW (ref 101–111)
Creatinine, Ser: 0.89 mg/dL (ref 0.44–1.00)
GFR calc Af Amer: >60 mL/min (ref >60)
GLUCOSE: 134 mg/dL — AB (ref 65–99)
POTASSIUM: 3.8 mmol/L (ref 3.5–5.1)
SODIUM: 128 mmol/L — AB (ref 135–145)

## 2016-05-28 LAB — CBC
HEMATOCRIT: 34 % — AB (ref 36.0–46.0)
HEMOGLOBIN: 11.4 g/dL — AB (ref 12.0–15.0)
MCH: 31.9 pg (ref 26.0–34.0)
MCHC: 33.5 g/dL (ref 30.0–36.0)
MCV: 95.2 fL (ref 78.0–100.0)
PLATELETS: 187 10*3/uL (ref 150–400)
RBC: 3.57 MIL/uL — ABNORMAL LOW (ref 3.87–5.11)
RDW: 12.3 % (ref 11.5–15.5)
WBC: 9.3 10*3/uL (ref 4.0–10.5)

## 2016-05-28 NOTE — Op Note (Addendum)
TOTAL KNEE REPLACEMENT OPERATIVE NOTE:  05/27/2016  8:29 AM  PATIENT:  Rebecca Glenn  81 y.o. female  PRE-OPERATIVE DIAGNOSIS:  primary osteoarthritis left knee  POST-OPERATIVE DIAGNOSIS:  primary osteoarthritis left knee  PROCEDURE:  Procedure(s): TOTAL KNEE ARTHROPLASTY  SURGEON:  Surgeon(s): Dannielle HuhSteve Johanne Mcglade, MD  PHYSICIAN ASSISTANT:J. Skip MayerBlair Roberts, PAC    ANESTHESIA:   spinal  DRAINS: Hemovac  SPECIMEN: None  COUNTS:  Correct  TOURNIQUET:   Total Tourniquet Time Documented: Thigh (Left) - 48 minutes Total: Thigh (Left) - 48 minutes   DICTATION:  Indication for procedure:    The patient is a 81 y.o. female who has failed conservative treatment for primary osteoarthritis left knee.  Informed consent was obtained prior to anesthesia. The risks versus benefits of the operation were explain and in a way the patient can, and did, understand.   On the implant demand matching protocol, this patient scored 10.  Therefore, this patient did" "did not receive a polyethylene insert with vitamin E which is a high demand implant.  Description of procedure:     The patient was taken to the operating room and placed under anesthesia.  The patient was positioned in the usual fashion taking care that all body parts were adequately padded and/or protected.  I foley catheter was not placed.  A tourniquet was applied and the leg prepped and draped in the usual sterile fashion.  The extremity was exsanguinated with the esmarch and tourniquet inflated to 350 mmHg.  Pre-operative range of motion was normal.  The knee was in 10 degree of mild varus.  A midline incision approximately 6-7 inches long was made with a #10 blade.  A new blade was used to make a parapatellar arthrotomy going 2-3 cm into the quadriceps tendon, over the patella, and alongside the medial aspect of the patellar tendon.  A synovectomy was then performed with the #10 blade and forceps. I then elevated the deep MCL off the  medial tibial metaphysis subperiosteally around to the semimembranosus attachment.    I everted the patella and used calipers to measure patellar thickness.  I used the reamer to ream down to appropriate thickness to recreate the native thickness.  I then removed excess bone with the rongeur and sagittal saw.  I used the appropriately sized template and drilled the three lug holes.  I then put the trial in place and measured the thickness with the calipers to ensure recreation of the native thickness.  The trial was then removed and the patella subluxed and the knee brought into flexion.  A homan retractor was place to retract and protect the patella and lateral structures.  A Z-retractor was place medially to protect the medial structures.  The extra-medullary alignment system was used to make cut the tibial articular surface perpendicular to the anamotic axis of the tibia and in 3 degrees of posterior slope.  The cut surface and alignment jig was removed.  I then used the intramedullary alignment guide to make a 6 valgus cut on the distal femur.  I then marked out the epicondylar axis on the distal femur.  The posterior condylar axis measured 3 degrees.  I then used the anterior referencing sizer and measured the femur to be a size 9.  The 4-In-1 cutting block was screwed into place in external rotation matching the posterior condylar angle, making our cuts perpendicular to the epicondylar axis.  Anterior, posterior and chamfer cuts were made with the sagittal saw.  The cutting block and cut  pieces were removed.  A lamina spreader was placed in 90 degrees of flexion.  The ACL, PCL, menisci, and posterior condylar osteophytes were removed.  A 11 mm spacer blocked was found to offer good flexion and extension gap balance after minimal in degree releasing.   The scoop retractor was then placed and the femoral finishing block was pinned in place.  The small sagittal saw was used as well as the lug drill to  finish the femur.  The block and cut surfaces were removed and the medullary canal hole filled with autograft bone from the cut pieces.  The tibia was delivered forward in deep flexion and external rotation.  A size E tray was selected and pinned into place centered on the medial 1/3 of the tibial tubercle.  The reamer and keel was used to prepare the tibia through the tray.    I then trialed with the size 9 femur, size E tibia, a 11 mm insert and the 35 patella.  I had excellent flexion/extension gap balance, excellent patella tracking.  Flexion was full and beyond 120 degrees; extension was zero.  These components were chosen and the staff opened them to me on the back table while the knee was lavaged copiously and the cement mixed.  The soft tissue was infiltrated with 60cc of exparel 1.3% through a 21 gauge needle.  I cemented in the components and removed all excess cement.  The polyethylene tibial component was snapped into place and the knee placed in extension while cement was hardening.  The capsule was infilltrated with 30cc of .25% Marcaine with epinephrine.  A hemovac was place in the joint exiting superolaterally.  A pain pump was place superomedially superficial to the arthrotomy.  Once the cement was hard, the tourniquet was let down.  Hemostasis was obtained.  The arthrotomy was closed with figure-8 #1 vicryl sutures.  The deep soft tissues were closed with #0 vicryls and the subcuticular layer closed with a running #2-0 vicryl.  The skin was reapproximated and closed with skin staples.  The wound was dressed with xeroform, 4 x4's, 2 ABD sponges, a single layer of webril and a TED stocking.   The patient was then awakened, extubated, and taken to the recovery room in stable condition.  BLOOD LOSS:  300cc DRAINS: 1 hemovac, 1 pain catheter COMPLICATIONS:  None.  PLAN OF CARE: Admit to inpatient   PATIENT DISPOSITION:  PACU - hemodynamically stable.   Delay start of Pharmacological  VTE agent (>24hrs) due to surgical blood loss or risk of bleeding:  not applicable  Please fax a copy of this op note to my office at [phone] (please only include page 1 and 2 of the Case Information op note)

## 2016-05-28 NOTE — Progress Notes (Signed)
Physical Therapy Treatment Patient Details Name: Rebecca CharsWilma Pies MRN: [id number] DOB: 02/12/1935 Today's Date: 05/28/2016    History of Present Illness Patient is a 81 y/o female with hx of HTN, hypothyroidism, depression presents s/p left TKA.     PT Comments    Pt progressing slowly with  PT at this time.  Will f/u this pm to advance gait training.  Will inform supervising PT if pm session remains slow with progress.     Follow Up Recommendations  Home health PT;Supervision for mobility/OOB     Equipment Recommendations  None recommended by PT    Recommendations for Other Services       Precautions / Restrictions Precautions Precautions: Fall;Knee Precaution Booklet Issued: No Precaution Comments: Reviewed no pillow under knee and precautions. Restrictions Weight Bearing Restrictions: Yes LLE Weight Bearing: Weight bearing as tolerated    Mobility  Bed Mobility Overal bed mobility: Needs Assistance Bed Mobility: Supine to Sit     Supine to sit: Min assist     General bed mobility comments: Pt required assistance to advance LLE to edge of bed.  Pt slow to ascend from seated surface.    Transfers Overall transfer level: Needs assistance Equipment used: Rolling walker (2 wheeled) Transfers: Sit to/from Stand Sit to Stand: From elevated surface;Min assist         General transfer comment: Cues for hand placement to and from seated surface.    Ambulation/Gait Ambulation/Gait assistance: Min assist Ambulation Distance (Feet): 8 Feet Assistive device: Rolling walker (2 wheeled) Gait Pattern/deviations: Step-to pattern;Decreased stride length;Decreased stance time - left;Decreased step length - right;Antalgic;Trunk flexed Gait velocity: decreased   General Gait Details: Pt remains slow and unsteady with gait training.  Pt required seated break after 8 ft and refused to progress gait further.  Pt required cues for sequencing and upright posture.     Stairs             Wheelchair Mobility    Modified Rankin (Stroke Patients Only)       Balance Overall balance assessment: Needs assistance   Sitting balance-Leahy Scale: Fair       Standing balance-Leahy Scale: Poor                      Cognition Arousal/Alertness: Awake/alert Behavior During Therapy: WFL for tasks assessed/performed Overall Cognitive Status: Within Functional Limits for tasks assessed                      Exercises Total Joint Exercises Ankle Circles/Pumps: Both;15 reps;Supine Quad Sets: 10 reps;Supine;Left Towel Squeeze: Both;Supine;10 reps;AROM Short Arc Quad: AAROM;Left;10 reps;Supine Heel Slides: AAROM;Left;10 reps;Supine Hip ABduction/ADduction: AAROM;Left;10 reps;Supine Straight Leg Raises: AAROM;Left;10 reps;Supine    General Comments        Pertinent Vitals/Pain Pain Assessment: 0-10 Pain Score: 8  Pain Location: L knee Pain Descriptors / Indicators: Aching;Grimacing;Guarding;Tightness;Throbbing Pain Intervention(s): Monitored during session;Repositioned;Ice applied    Home Living                      Prior Function            PT Goals (current goals can now be found in the care plan section) Acute Rehab PT Goals Patient Stated Goal: to get back to independence Potential to Achieve Goals: Fair Progress towards PT goals: Progressing toward goals    Frequency  7X/week    PT Plan Current plan remains appropriate    Co-evaluation  End of Session Equipment Utilized During Treatment: Gait belt Activity Tolerance: Patient limited by pain;Patient tolerated treatment well Patient left: in chair;with call bell/phone within reach;with family/visitor present     Time: 1032-1109 PT Time Calculation (min) (ACUTE ONLY): 37 min  Charges:  $Therapeutic Exercise: 8-22 mins $Therapeutic Activity: 8-22 mins                    G Codes:      Florestine Aversimee J Mishka Stegemann 05/28/2016, 1:11 PM Joycelyn RuaAimee Carrah Eppolito, PTA pager  [phone]

## 2016-05-28 NOTE — Progress Notes (Signed)
Orthopedic Tech Progress Note Patient Details:  Rebecca Glenn 05/07/1935 [id number] Ortho visit put on cpm at 1540 Patient ID: Rebecca Glenn, female   DOB: 04/17/1935, 81 y.o.   MRN: [id number]   [name], [name] 05/28/2016, 3:38 PM

## 2016-05-28 NOTE — Progress Notes (Signed)
Physical Therapy Treatment Patient Details Name: Rebecca CharsWilma Glenn MRN: [id number] DOB: 10/25/1935 Today's Date: 05/28/2016    History of Present Illness Patient is a 81 y/o female with hx of HTN, hypothyroidism, depression presents s/p left TKA.     PT Comments    Pt performed increased gait but became increasingly lethargic after gait and there fore therapeutic exercise deferred.  Pt did allow PTA to range knee this pm.    Follow Up Recommendations  Home health PT;Supervision for mobility/OOB     Equipment Recommendations  None recommended by PT    Recommendations for Other Services OT consult     Precautions / Restrictions Precautions Precautions: Fall;Knee Precaution Booklet Issued: No Precaution Comments: Reviewed no pillow under knee and precautions. Restrictions Weight Bearing Restrictions: Yes LLE Weight Bearing: Weight bearing as tolerated    Mobility  Bed Mobility Overal bed mobility: Needs Assistance Bed Mobility: Supine to Sit     Supine to sit: Supervision Sit to supine: Supervision   General bed mobility comments: Pt required assistance to advance LLE to edge of bed.  Pt slow to ascend from seated surface.    Transfers Overall transfer level: Needs assistance Equipment used: Rolling walker (2 wheeled) Transfers: Sit to/from Stand Sit to Stand: Min guard         General transfer comment: Cues for hand placement and LE advancement to ease pain on LLE.    Ambulation/Gait Ambulation/Gait assistance: Min assist Ambulation Distance (Feet): 35 Feet Assistive device: Rolling walker (2 wheeled) Gait Pattern/deviations: Step-to pattern;Decreased stride length;Decreased stance time - left;Decreased step length - right;Antalgic;Trunk flexed Gait velocity: decreased   General Gait Details: Pt presents with slow gait, but able to progress distance with max cues for encouragement.  Pt remains to require cues for sequencing and upright posture.     Stairs            Wheelchair Mobility    Modified Rankin (Stroke Patients Only)       Balance     Sitting balance-Leahy Scale: Fair       Standing balance-Leahy Scale: Poor                      Cognition Arousal/Alertness: Awake/alert Behavior During Therapy: WFL for tasks assessed/performed Overall Cognitive Status: Within Functional Limits for tasks assessed                      Exercises Total Joint Exercises Goniometric ROM: 11-63 degrees.  performed ROM this pm only, deferred ther ex as patient having difficulty staying awake.      General Comments        Pertinent Vitals/Pain Pain Assessment: 0-10 Pain Score: 6  Pain Descriptors / Indicators: Grimacing;Guarding Pain Intervention(s): Monitored during session;Repositioned;Ice applied    Home Living                      Prior Function            PT Goals (current goals can now be found in the care plan section) Acute Rehab PT Goals Patient Stated Goal: to get back to independence Potential to Achieve Goals: Fair Progress towards PT goals: Progressing toward goals    Frequency  7X/week    PT Plan Current plan remains appropriate    Co-evaluation             End of Session Equipment Utilized During Treatment: Gait belt Activity Tolerance: Patient limited by pain;Patient tolerated  treatment well Patient left: in chair;with call bell/phone within reach;with family/visitor present     Time: [id number] PT Time Calculation (min) (ACUTE ONLY): 31 min  Charges:  $Gait Training: 8-22 mins $Therapeutic Activity: 8-22 mins                    G Codes:      Rebecca Glenn 05/28/2016, 5:14 PM  Rebecca Glenn, PTA pager [phone]

## 2016-05-28 NOTE — Progress Notes (Signed)
SPORTS MEDICINE AND JOINT REPLACEMENT  Rebecca SpurlingStephen Lucey, MD    Rebecca Nancyolby Myli Pae, PA-C [address], [place], [place]  [id number]                             [phone]   PROGRESS NOTE  Subjective:  negative for Chest Pain  negative for Shortness of Breath  negative for Nausea/Vomiting   negative for Calf Pain  negative for Bowel Movement   Tolerating Diet: yes         Patient reports pain as 5 on 0-10 scale.    Objective: Vital signs in last 24 hours:   Patient Vitals for the past 24 hrs:  BP Temp Temp src Pulse Resp SpO2  05/28/16 0416 114/67 mmHg - - 85 - 97 %  05/28/16 0046 (!) 156/68 mmHg - - 81 - 98 %  05/27/16 1952 92/64 mmHg 97.6 F (36.4 C) Oral 81 - 90 %  05/27/16 1324 (!) 146/71 mmHg 97 F (36.1 C) Oral 63 18 98 %  05/27/16 1227 - - - 60 18 99 %  05/27/16 1226 - - - 60 16 98 %  05/27/16 1225 - - - 60 12 99 %  05/27/16 1224 137/74 mmHg 98.1 F (36.7 C) - 62 17 97 %  05/27/16 1215 - - - (!) 59 15 97 %  05/27/16 1209 120/67 mmHg - - (!) 59 13 98 %  05/27/16 1200 - - - (!) 58 17 97 %  05/27/16 1154 137/77 mmHg - - (!) 59 17 100 %  05/27/16 1145 - - - (!) 58 14 100 %  05/27/16 1139 109/77 mmHg - - (!) 58 20 98 %  05/27/16 1130 - - - (!) 59 15 99 %  05/27/16 1124 119/72 mmHg - - (!) 58 14 98 %  05/27/16 1115 - - - (!) 58 19 100 %  05/27/16 1110 - 97.9 F (36.6 C) - - - -  05/27/16 1109 111/60 mmHg - - 60 18 100 %    @flow {1959:LAST@   Intake/Output from previous day:   06/26 0701 - 06/27 0700 In: 1100 [I.V.:1100] Out: 350 [Urine:300]   Intake/Output this shift:       Intake/Output      06/26 0701 - 06/27 0700 06/27 0701 - 06/28 0700   I.V. (mL/kg) 1100 (13.8)    Total Intake(mL/kg) 1100 (13.8)    Urine (mL/kg/hr) 300 (0.2)    Blood 50 (0)    Total Output 350     Net +750          Urine Occurrence 3 x       LABORATORY DATA:  Recent Labs  05/28/16 0544  WBC 9.3  HGB 11.4*  HCT 34.0*  PLT 187    Recent Labs  05/28/16 0544  NA 128*   K 3.8  CL 91*  CO2 31  BUN 5*  CREATININE 0.89  GLUCOSE 134*  CALCIUM 8.2*   Lab Results  Component Value Date   INR 1.02 05/17/2016    Examination:  General appearance: alert, cooperative and no distress Extremities: extremities normal, atraumatic, no cyanosis or edema  Wound Exam: clean, dry, intact   Drainage:  None: wound tissue dry  Motor Exam: Quadriceps and Hamstrings Intact  Sensory Exam: Superficial Peroneal, Deep Peroneal and Tibial normal   Assessment:    1 Day Post-Op  Procedure(s) (LRB): TOTAL KNEE ARTHROPLASTY (Left)  ADDITIONAL DIAGNOSIS:  Active  Problems:   S/P total knee replacement  Acute Blood Loss Anemia   Plan: Physical Therapy as ordered Weight Bearing as Tolerated (WBAT)  DVT Prophylaxis:  Aspirin  DISCHARGE PLAN: Home  DISCHARGE NEEDS: HHPT   Patient is doing ok, will determine D/C based upon PT.          Guy SandiferColby Alan Vinayak Bobier 05/28/2016, 7:10 AM

## 2016-05-28 NOTE — Care Management Important Message (Signed)
Important Message  Patient Details  Name: Rebecca Glenn MRN: [id number] Date of Birth: 02/12/1935   Medicare Important Message Given:  Yes    Bernadette HoitShoffner, Shylo Dillenbeck Coleman 05/28/2016, 8:12 AM

## 2016-05-28 NOTE — Progress Notes (Signed)
Orthopedic Tech Progress Note Patient Details:  Rebecca Glenn 07/06/1935 [id number]  Patient ID: Rebecca Glenn, female   DOB: 03/30/1935, 81 y.o.   MRN: [id number] Applied cpm 0-50. 0-60 was to much pain after pain meds.  Trinna PostMartinez, Chelsye Suhre J 05/28/2016, 6:05 AM

## 2016-05-28 NOTE — Evaluation (Signed)
Occupational Therapy Evaluation and Discharge Patient Details Name: Rebecca CharsWilma Glenn MRN: [id number] DOB: 03/21/1935 Today's Date: 05/28/2016    History of Present Illness Patient is a 81 y/o female with hx of HTN, hypothyroidism, depression presents s/p left TKA.    Clinical Impression   Pt was functioning at a modified independent level prior to admission. Presents with post operative pain, generalized weakness and impaired standing balance. Educated pt in use of AE for LB bathing and dressing. Pt is familiar with use of DME from previous R TKA. Will have 24 hour assist of her family at discharge. No further OT needs.    Follow Up Recommendations  No OT follow up;Supervision/Assistance - 24 hour    Equipment Recommendations  None recommended by OT    Recommendations for Other Services       Precautions / Restrictions Precautions Precautions: Fall;Knee Restrictions Weight Bearing Restrictions: Yes LLE Weight Bearing: Weight bearing as tolerated      Mobility Bed Mobility   Bed Mobility: Supine to Sit;Sit to Supine     Supine to sit: Supervision Sit to supine: Supervision   General bed mobility comments: no assist needed, used rail, hooked R LE under her L   Transfers Overall transfer level: Needs assistance Equipment used: Rolling walker (2 wheeled) Transfers: Sit to/from Stand Sit to Stand: Min guard;From elevated surface         General transfer comment: for safety from bed    Balance                                            ADL Overall ADL's : Needs assistance/impaired Eating/Feeding: Independent;Bed level   Grooming: Set up;Sitting   Upper Body Bathing: Set up;Sitting   Lower Body Bathing: Minimal assistance;Sit to/from stand Lower Body Bathing Details (indicate cue type and reason): recommended long handled bath sponge Upper Body Dressing : Set up;Sitting   Lower Body Dressing: Minimal assistance;Sit to/from stand Lower Body  Dressing Details (indicate cue type and reason): educated pt in use of reacher and sock aide             Functional mobility during ADLs: Min guard;Rolling walker ([facility] steps to [facility])       Vision     Perception     Praxis      Pertinent Vitals/Pain Pain Assessment: Faces Pain Score: 10-Worst pain ever Pain Location: L knee Pain Descriptors / Indicators: Aching;Guarding;Grimacing Pain Intervention(s): Premedicated before session;Repositioned;Ice applied;Monitored during session;Limited activity within patient's tolerance     Hand Dominance Right   Extremity/Trunk Assessment Upper Extremity Assessment Upper Extremity Assessment: Overall WFL for tasks assessed   Lower Extremity Assessment Lower Extremity Assessment: Defer to PT evaluation LLE Deficits / Details: Limited AROM/strength secondary to pain/surgery       Communication Communication Communication: No difficulties   Cognition Arousal/Alertness: Awake/alert Behavior During Therapy: WFL for tasks assessed/performed Overall Cognitive Status: Within Functional Limits for tasks assessed                     General Comments       Exercises       Shoulder Instructions      Home Living Family/patient expects to be discharged to:: Private residence Living Arrangements: Alone;Children Available Help at Discharge: Family;Available 24 hours/day;Neighbor Type of Home: House Home Access: Stairs to enter Entergy CorporationEntrance Stairs-Number of Steps: 2 Entrance  Stairs-Rails: None Home Layout: One level     Bathroom Shower/Tub: Producer, television/film/videoWalk-in shower   Bathroom Toilet: Standard     Home Equipment: Environmental consultantWalker - 2 wheels;Bedside commode;Wheelchair - manual;Cane - single point;Tub bench;Shower seat - built Designer, fashion/clothingin;Adaptive equipment Adaptive Equipment: Reacher        Prior Functioning/Environment Level of Independence: Independent        Comments: Pt furniture walking at baseline due to pain.     OT Diagnosis:  Generalized weakness;Acute pain   OT Problem List:     OT Treatment/Interventions:      OT Goals(Current goals can be found in the care plan section) Acute Rehab OT Goals Patient Stated Goal: to ge tback to independence  OT Frequency:     Barriers to D/C:            Co-evaluation              End of Session Equipment Utilized During Treatment: Rolling walker CPM Left Knee CPM Left Knee: Off Left Knee Flexion (Degrees): 50 Left Knee Extension (Degrees): 0  Activity Tolerance: Patient limited by pain Patient left: in bed;with call bell/phone within reach;with bed alarm set   Time: 1610-96040907-0923 OT Time Calculation (min): 16 min Charges:  OT General Charges $OT Visit: 1 Procedure OT Evaluation $OT Eval Low Complexity: 1 Procedure G-Codes:    Evern BioMayberry, Davion Flannery Lynn 05/28/2016, 9:37 AM  [phone]

## 2016-05-28 NOTE — Care Management Note (Signed)
Case Management Note  Patient Details  Name: Rebecca Glenn MRN: [id number] Date of Birth: 12/10/1934  Subjective/Objective:       81 yr old female s/p left total knee arthroplasty             Action/Plan:  Patient was preoperatively setup with [facility]. No changes. Patient's daughter states she will be staying with her for recovery and HH already has her information.    Expected Discharge Date:   05/29/16               [facility]  In-House Referral:     Discharge planning Services  CM Consult  Post Acute Care Choice:  Durable Medical Equipment Choice offered to:  Patient  DME Arranged:  CPM DME Agency:  [facility]  HH Arranged:  [facility]  Status of Service:  Completed, signed off  If discussed at Long Length of Stay Meetings, dates discussed:    Additional Comments:  Durenda GuthrieBrady, Marg Macmaster Naomi, RN 05/28/2016, 3:20 PM

## 2016-05-29 LAB — CBC
HCT: 31.1 % — ABNORMAL LOW (ref 36.0–46.0)
HEMOGLOBIN: 10.4 g/dL — AB (ref 12.0–15.0)
MCH: 30.6 pg (ref 26.0–34.0)
MCHC: 33.4 g/dL (ref 30.0–36.0)
MCV: 91.5 fL (ref 78.0–100.0)
Platelets: 188 10*3/uL (ref 150–400)
RBC: 3.4 MIL/uL — ABNORMAL LOW (ref 3.87–5.11)
RDW: 12.1 % (ref 11.5–15.5)
WBC: 11.8 10*3/uL — ABNORMAL HIGH (ref 4.0–10.5)

## 2016-05-29 MED ORDER — METHOCARBAMOL 500 MG PO TABS: 500.0000 mg | ORAL_TABLET | Freq: Four times a day (QID) | ORAL | Status: DC | PRN

## 2016-05-29 MED ORDER — ONDANSETRON HCL 4 MG PO TABS: 4.0000 mg | ORAL_TABLET | Freq: Four times a day (QID) | ORAL | Status: DC | PRN

## 2016-05-29 MED ORDER — ASPIRIN 325 MG PO TBEC: 325.0000 mg | DELAYED_RELEASE_TABLET | Freq: Two times a day (BID) | ORAL | Status: DC

## 2016-05-29 MED ORDER — OXYCODONE HCL 5 MG PO TABS: 5.0000 mg | ORAL_TABLET | ORAL | Status: DC | PRN

## 2016-05-29 NOTE — Discharge Summary (Signed)
SPORTS MEDICINE & JOINT REPLACEMENT   Georgena SpurlingStephen Lucey, MD   Laurier Nancyolby Marthe Dant, PA-C [address], [place], [place]  [address]                             [phone]  PATIENT ID: Rebecca CharsWilma Kreutzer        MRN:  [id number]          DOB/AGE: 79/04/1935 / 81 y.o.    DISCHARGE SUMMARY  ADMISSION DATE:    05/27/2016 DISCHARGE DATE:   05/29/2016   ADMISSION DIAGNOSIS: primary osteoarthritis left knee    DISCHARGE DIAGNOSIS:  primary osteoarthritis left knee    ADDITIONAL DIAGNOSIS: Active Problems:   S/P total knee replacement  Past Medical History  Diagnosis Date  . Hypertension   . Hypothyroidism   . Depression   . GERD (gastroesophageal reflux disease)     used to take nexium, takes tums  . Headache   . Migraine     infrequent  . Arthritis     osteoarthritis  . Diverticulosis     on colonoscopy  . Complication of anesthesia     sever pain in lower back after spinal block 25 years ago  . Blepharitis of both eyes     PROCEDURE: Procedure(s): TOTAL KNEE ARTHROPLASTY on 05/27/2016  CONSULTS:     HISTORY:  See H&P in chart  HOSPITAL COURSE:  Rebecca CharsWilma Wolanski is a 81 y.o. admitted on 05/27/2016 and found to have a diagnosis of primary osteoarthritis left knee.  After appropriate laboratory studies were obtained  they were taken to the operating room on 05/27/2016 and underwent Procedure(s): TOTAL KNEE ARTHROPLASTY.   They were given perioperative antibiotics:  Anti-infectives    Start     Dose/Rate Route Frequency Ordered Stop   05/27/16 1500  ceFAZolin (ANCEF) IVPB 1 g/50 mL premix     1 g 100 mL/hr over 30 Minutes Intravenous Every 6 hours 05/27/16 1251 05/27/16 2236   05/27/16 0800  ceFAZolin (ANCEF) IVPB 2g/100 mL premix     2 g 200 mL/hr over 30 Minutes Intravenous To ShortStay Surgical 05/24/16 1057 05/27/16 0852    .  Patient given tranexamic acid IV or topical and exparel intra-operatively.  Tolerated the procedure well.    POD# 1: Vital signs were stable.  Patient  denied Chest pain, shortness of breath, or calf pain.  Patient was started on Lovenox 30 mg subcutaneously twice daily at 8am.  Consults to PT, OT, and care management were made.  The patient was weight bearing as tolerated.  CPM was placed on the operative leg 0-90 degrees for 6-8 hours a day. When out of the CPM, patient was placed in the foam block to achieve full extension. Incentive spirometry was taught.  Dressing was changed.       POD #2, Continued  PT for ambulation and exercise program.  IV saline locked.  O2 discontinued.    The remainder of the hospital course was dedicated to ambulation and strengthening.   The patient was discharged on 2 Days Post-Op in  Good condition.  Blood products given:none  DIAGNOSTIC STUDIES: Recent vital signs: Patient Vitals for the past 24 hrs:  BP Temp Temp src Pulse Resp SpO2  05/29/16 0503 (!) 152/60 mmHg 98.4 F (36.9 C) Oral 84 18 98 %  05/28/16 2001 (!) 156/72 mmHg 98.6 F (37 C) Oral 80 18 98 %  05/28/16 1249 (!) 157/74 mmHg 98.1 F (36.7  C) Oral 85 18 99 %  05/28/16 1105 (!) 152/68 mmHg - - 85 - -       Recent laboratory studies:  Recent Labs  05/28/16 0544 05/29/16 0340  WBC 9.3 11.8*  HGB 11.4* 10.4*  HCT 34.0* 31.1*  PLT 187 188    Recent Labs  05/28/16 0544  NA 128*  K 3.8  CL 91*  CO2 31  BUN 5*  CREATININE 0.89  GLUCOSE 134*  CALCIUM 8.2*   Lab Results  Component Value Date   INR 1.02 05/17/2016     Recent Radiographic Studies :  Dg Chest 2 View  05/17/2016  CLINICAL DATA:  Preop for left total knee arthroplasty. EXAM: CHEST  2 VIEW COMPARISON:  None. FINDINGS: The heart size and mediastinal contours are within normal limits. Both lungs are clear. The visualized skeletal structures are unremarkable. IMPRESSION: No active cardiopulmonary disease. Electronically Signed   By: Lupita RaiderJames  Green Jr, M.D.   On: 05/17/2016 16:27    DISCHARGE INSTRUCTIONS:   DISCHARGE MEDICATIONS:     Medication List    ASK your  doctor about these medications        buPROPion 300 MG 24 hr tablet  Commonly known as:  WELLBUTRIN XL  Take 1 tablet by mouth daily.     erythromycin ophthalmic ointment  Place 1 application into both eyes at bedtime.     escitalopram 20 MG tablet  Commonly known as:  LEXAPRO  Take 1 tablet by mouth daily.     EST ESTROGENS-METHYLTEST HS 0.625-1.25 MG tablet  Generic drug:  estrogen-methylTESTOSTERone  Take 1 tablet by mouth daily.     LORazepam 1 MG tablet  Commonly known as:  ATIVAN  TAKE 1 TABLET BY MOUTH ONE HOUR BEFORE MRI,THEN TAKE 2 TABLETS TO APPOINTMENT     losartan-hydrochlorothiazide 50-12.5 MG tablet  Commonly known as:  HYZAAR  Take 0.5 tablets by mouth daily.     OVER THE COUNTER MEDICATION  Place 1 drop into both eyes 3 (three) times daily. Eye drop     oxyCODONE-acetaminophen 10-325 MG tablet  Commonly known as:  PERCOCET  Take 1 tablet by mouth every 4 (four) hours as needed.     propranolol 10 MG tablet  Commonly known as:  INDERAL  Take 10 mg by mouth daily.     sucralfate 1 g tablet  Commonly known as:  CARAFATE  Take 1 tablet by mouth 3 (three) times daily as needed.     SYNTHROID 25 MCG tablet  Generic drug:  levothyroxine  Take 1 tablet by mouth daily.     zaleplon 10 MG capsule  Commonly known as:  SONATA  Take 1 capsule by mouth daily as needed.        FOLLOW UP VISIT:       Follow-up Information    [facility]   Why:  Someone [facility] will contact you to arrange start date and time for therapy.   Contact information:   [address] [place] [address] [phone]       DISPOSITION: HOME VS. SNF  CONDITION:  Good   Guy SandiferColby Alan Zamier Eggebrecht 05/29/2016, 9:35 AM

## 2016-05-29 NOTE — Progress Notes (Signed)
Orthopedic Tech Progress Note Patient Details:  Rebecca Glenn 03/17/1935 [id number]  Patient ID: Rebecca Glenn, female   DOB: 09/30/1935, 81 y.o.   MRN: [id number] Applied cpm 0-60  Trinna PostMartinez, Zala Degrasse J 05/29/2016, 5:39 AM

## 2016-05-29 NOTE — Progress Notes (Signed)
Physical Therapy Treatment Patient Details Name: Rebecca Glenn MRN: [id number] DOB: 04/19/1935 Today's Date: 05/29/2016    History of Present Illness Patient is a 81 y/o female with hx of HTN, hypothyroidism, depression presents s/p left TKA.     PT Comments    Pt performed increased mobility and curb training.  Pt ready for d/c home.    Follow Up Recommendations  Home health PT;Supervision for mobility/OOB     Equipment Recommendations  None recommended by PT    Recommendations for Other Services OT consult     Precautions / Restrictions Precautions Precautions: Fall;Knee Precaution Booklet Issued: No Precaution Comments: Reviewed no pillow under knee and precautions. Restrictions Weight Bearing Restrictions: Yes LLE Weight Bearing: Weight bearing as tolerated    Mobility  Bed Mobility Overal bed mobility: Needs Assistance Bed Mobility: Supine to Sit     Supine to sit: Supervision Sit to supine: Supervision   General bed mobility comments: Pt required assistance to advance LLE to edge of bed.  Pt slow to ascend from seated surface.    Transfers Overall transfer level: Needs assistance Equipment used: Rolling walker (2 wheeled) Transfers: Sit to/from Stand Sit to Stand: Supervision         General transfer comment: Cues for hand placement and LE advancement to ease pain in LLE.    Ambulation/Gait Ambulation/Gait assistance: Supervision Ambulation Distance (Feet): 100 Feet Assistive device: Rolling walker (2 wheeled) Gait Pattern/deviations: Step-through pattern;Decreased stride length;Antalgic;Trunk flexed Gait velocity: decreased   General Gait Details: Pt more motivated with improved gait quality.  Pt required cues for upper trunk control.     Stairs Stairs: Yes Stairs assistance: Min assist Stair Management: No rails;Backwards Number of Stairs: 1 General stair comments: curb training x2 with teach back method.  Pt performed with RW and cues for  sequencing.    Wheelchair Mobility    Modified Rankin (Stroke Patients Only)       Balance Overall balance assessment: Needs assistance   Sitting balance-Leahy Scale: Fair       Standing balance-Leahy Scale: Poor                      Cognition Arousal/Alertness: Awake/alert Behavior During Therapy: WFL for tasks assessed/performed Overall Cognitive Status: Within Functional Limits for tasks assessed                      Exercises Total Joint Exercises Ankle Circles/Pumps: Both;15 reps;Supine Quad Sets: 10 reps;Supine;Left Gluteal Sets: Both;10 reps;Supine Towel Squeeze: Both;Supine;10 reps;AROM Short Arc Quad: AAROM;Left;10 reps;Supine Heel Slides: AAROM;Left;10 reps;Supine Hip ABduction/ADduction: AAROM;Left;10 reps;Supine Straight Leg Raises: AAROM;Left;10 reps;Supine Goniometric ROM: 11-72 degrees.    General Comments        Pertinent Vitals/Pain Pain Assessment: 0-10 Pain Score: 6  Pain Location: L knee Pain Descriptors / Indicators: Guarding;Grimacing Pain Intervention(s): Monitored during session;Repositioned;Ice applied    Home Living                      Prior Function            PT Goals (current goals can now be found in the care plan section) Acute Rehab PT Goals Patient Stated Goal: to get back to independence Potential to Achieve Goals: Fair Progress towards PT goals: Progressing toward goals    Frequency  7X/week    PT Plan Current plan remains appropriate    Co-evaluation  End of Session Equipment Utilized During Treatment: Gait belt Activity Tolerance: Patient limited by pain;Patient tolerated treatment well Patient left: in chair;with call bell/phone within reach;with family/visitor present     Time: [id number] PT Time Calculation (min) (ACUTE ONLY): 45 min  Charges:  $Gait Training: 8-22 mins $Therapeutic Exercise: 8-22 mins $Therapeutic Activity: 8-22 mins                    G  Codes:      Rebecca Glenn 05/29/2016, 5:06 PM  Rebecca Glenn, PTA pager [phone]

## 2016-05-29 NOTE — Progress Notes (Signed)
Reviewed discharge papers and medications with full understanding 

## 2016-05-29 NOTE — Progress Notes (Signed)
SPORTS MEDICINE AND JOINT REPLACEMENT  Rebecca SpurlingStephen Lucey, MD    Laurier Nancyolby Orlyn Odonoghue, PA-C [address], [place], [place]  [address]                             [phone]   PROGRESS NOTE  Subjective:  negative for Chest Pain  negative for Shortness of Breath  negative for Nausea/Vomiting   negative for Calf Pain  negative for Bowel Movement   Tolerating Diet: yes         Patient reports pain as 4 on 0-10 scale.    Objective: Vital signs in last 24 hours:   Patient Vitals for the past 24 hrs:  BP Temp Temp src Pulse Resp SpO2  05/29/16 0503 (!) 152/60 mmHg 98.4 F (36.9 C) Oral 84 18 98 %  05/28/16 2001 (!) 156/72 mmHg 98.6 F (37 C) Oral 80 18 98 %  05/28/16 1249 (!) 157/74 mmHg 98.1 F (36.7 C) Oral 85 18 99 %  05/28/16 1105 (!) 152/68 mmHg - - 85 - -    @flow {1959:LAST@   Intake/Output from previous day:   06/27 0701 - 06/28 0700 In: 1155 [P.O.:330; I.V.:825] Out: -    Intake/Output this shift:   06/28 0701 - 06/28 1900 In: 240 [P.O.:240] Out: -    Intake/Output      06/27 0701 - 06/28 0700 06/28 0701 - 06/29 0700   P.O. 330 240   I.V. (mL/kg) 825 (10.3)    Total Intake(mL/kg) 1155 (14.4) 240 (3)   Urine (mL/kg/hr)     Blood     Total Output       Net +1155 +240        Urine Occurrence 9 x 1 x      LABORATORY DATA:  Recent Labs  05/28/16 0544 05/29/16 0340  WBC 9.3 11.8*  HGB 11.4* 10.4*  HCT 34.0* 31.1*  PLT 187 188    Recent Labs  05/28/16 0544  NA 128*  K 3.8  CL 91*  CO2 31  BUN 5*  CREATININE 0.89  GLUCOSE 134*  CALCIUM 8.2*   Lab Results  Component Value Date   INR 1.02 05/17/2016    Examination:  General appearance: alert, cooperative and no distress Extremities: extremities normal, atraumatic, no cyanosis or edema  Wound Exam: clean, dry, intact   Drainage:  None: wound tissue dry  Motor Exam: Quadriceps and Hamstrings Intact  Sensory Exam: Superficial Peroneal, Deep Peroneal and Tibial normal   Assessment:     2 Days Post-Op  Procedure(s) (LRB): TOTAL KNEE ARTHROPLASTY (Left)  ADDITIONAL DIAGNOSIS:  Active Problems:   S/P total knee replacement  Acute Blood Loss Anemia   Plan: Physical Therapy as ordered Weight Bearing as Tolerated (WBAT)  DVT Prophylaxis:  Aspirin  DISCHARGE PLAN: Home  DISCHARGE NEEDS: HHPT   Patient looks great, will D/C today and may leave after this afternoons PT session         Guy SandiferColby Alan Rebecca Glenn 05/29/2016, 9:34 AM

## 2017-10-14 IMAGING — CR DG CHEST 2V
2 series · 2 of 2 positions shown · non-contrast
Comparison: None.

CLINICAL DATA: Preop for left total knee arthroplasty.

EXAM:
CHEST  2 VIEW

[w chest pa]
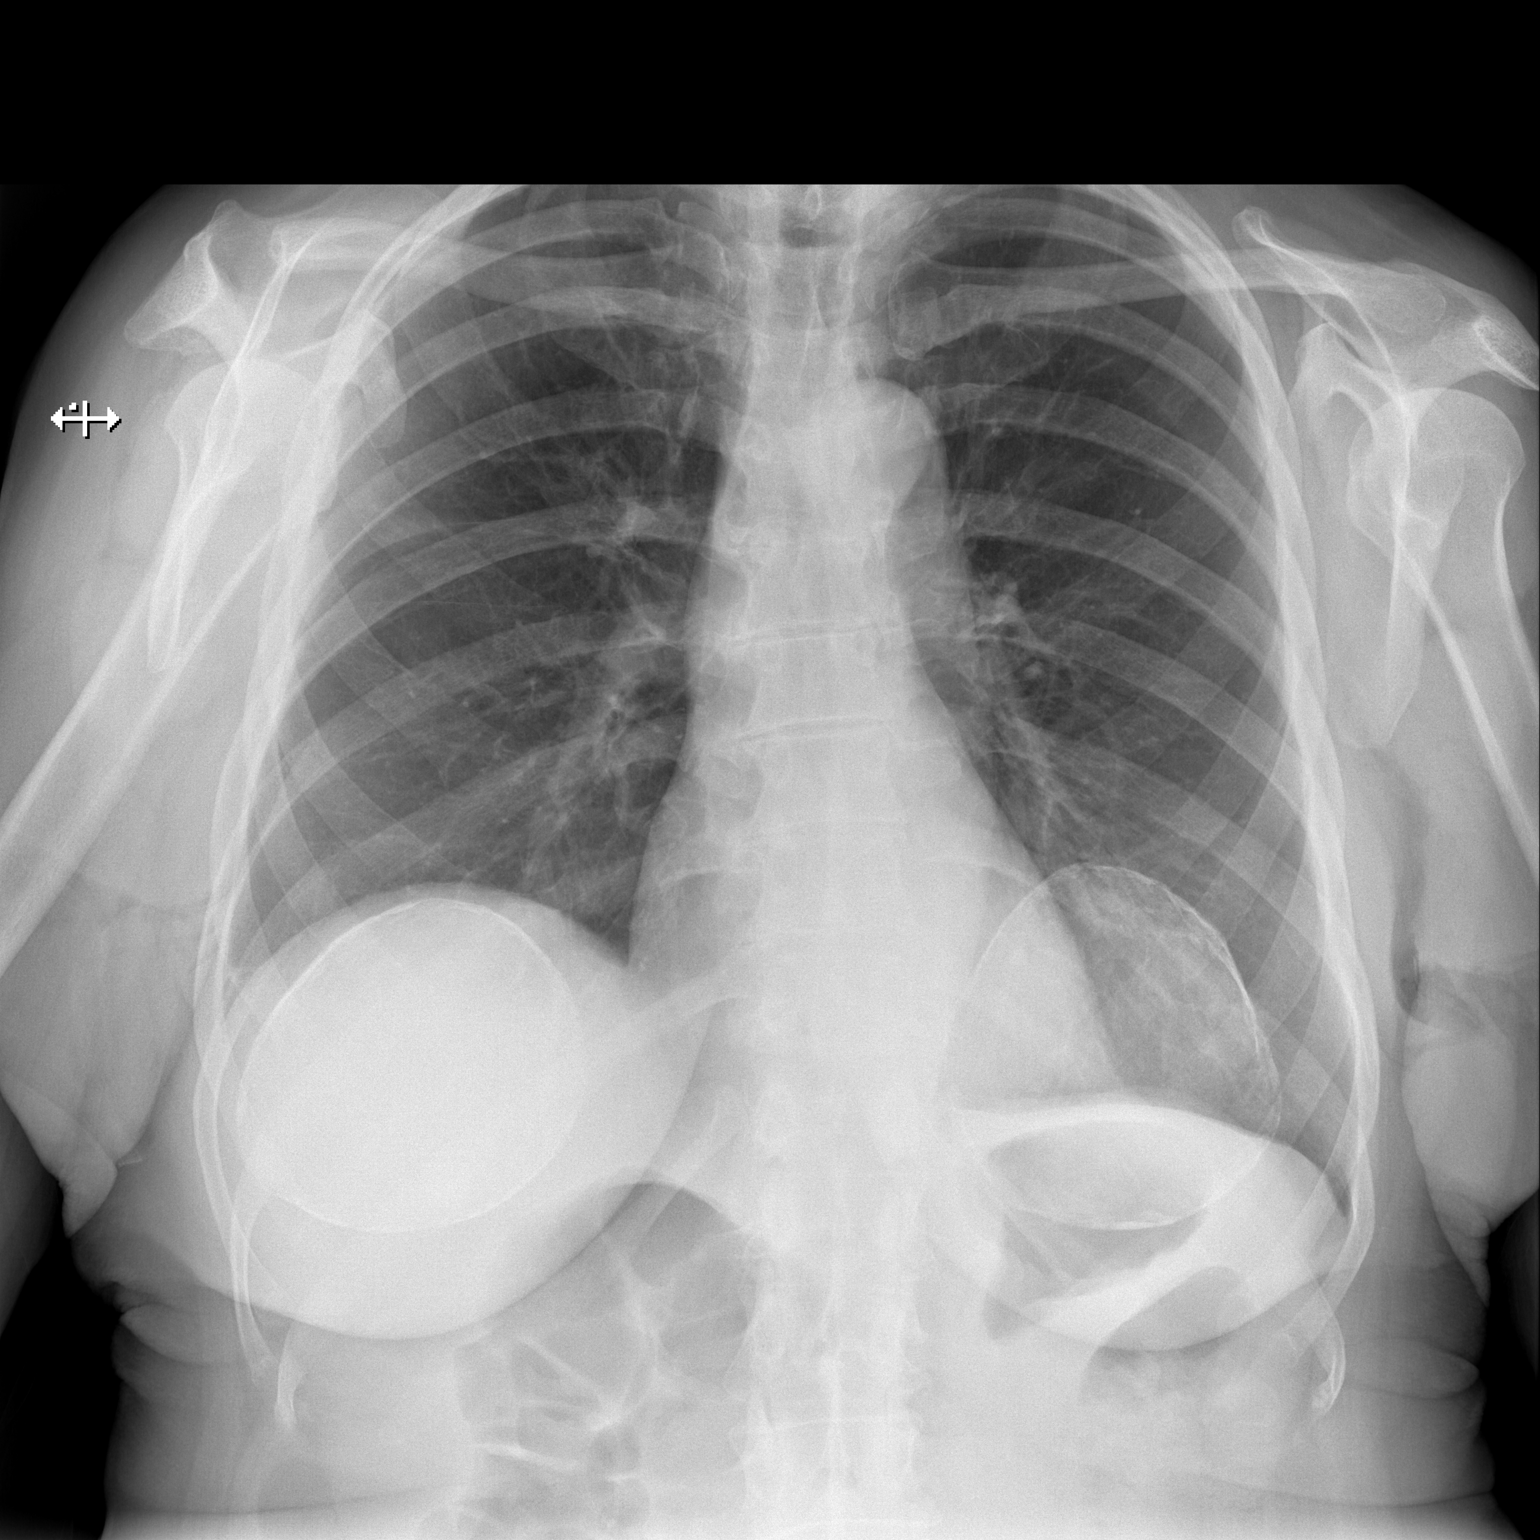

[w chest lat]
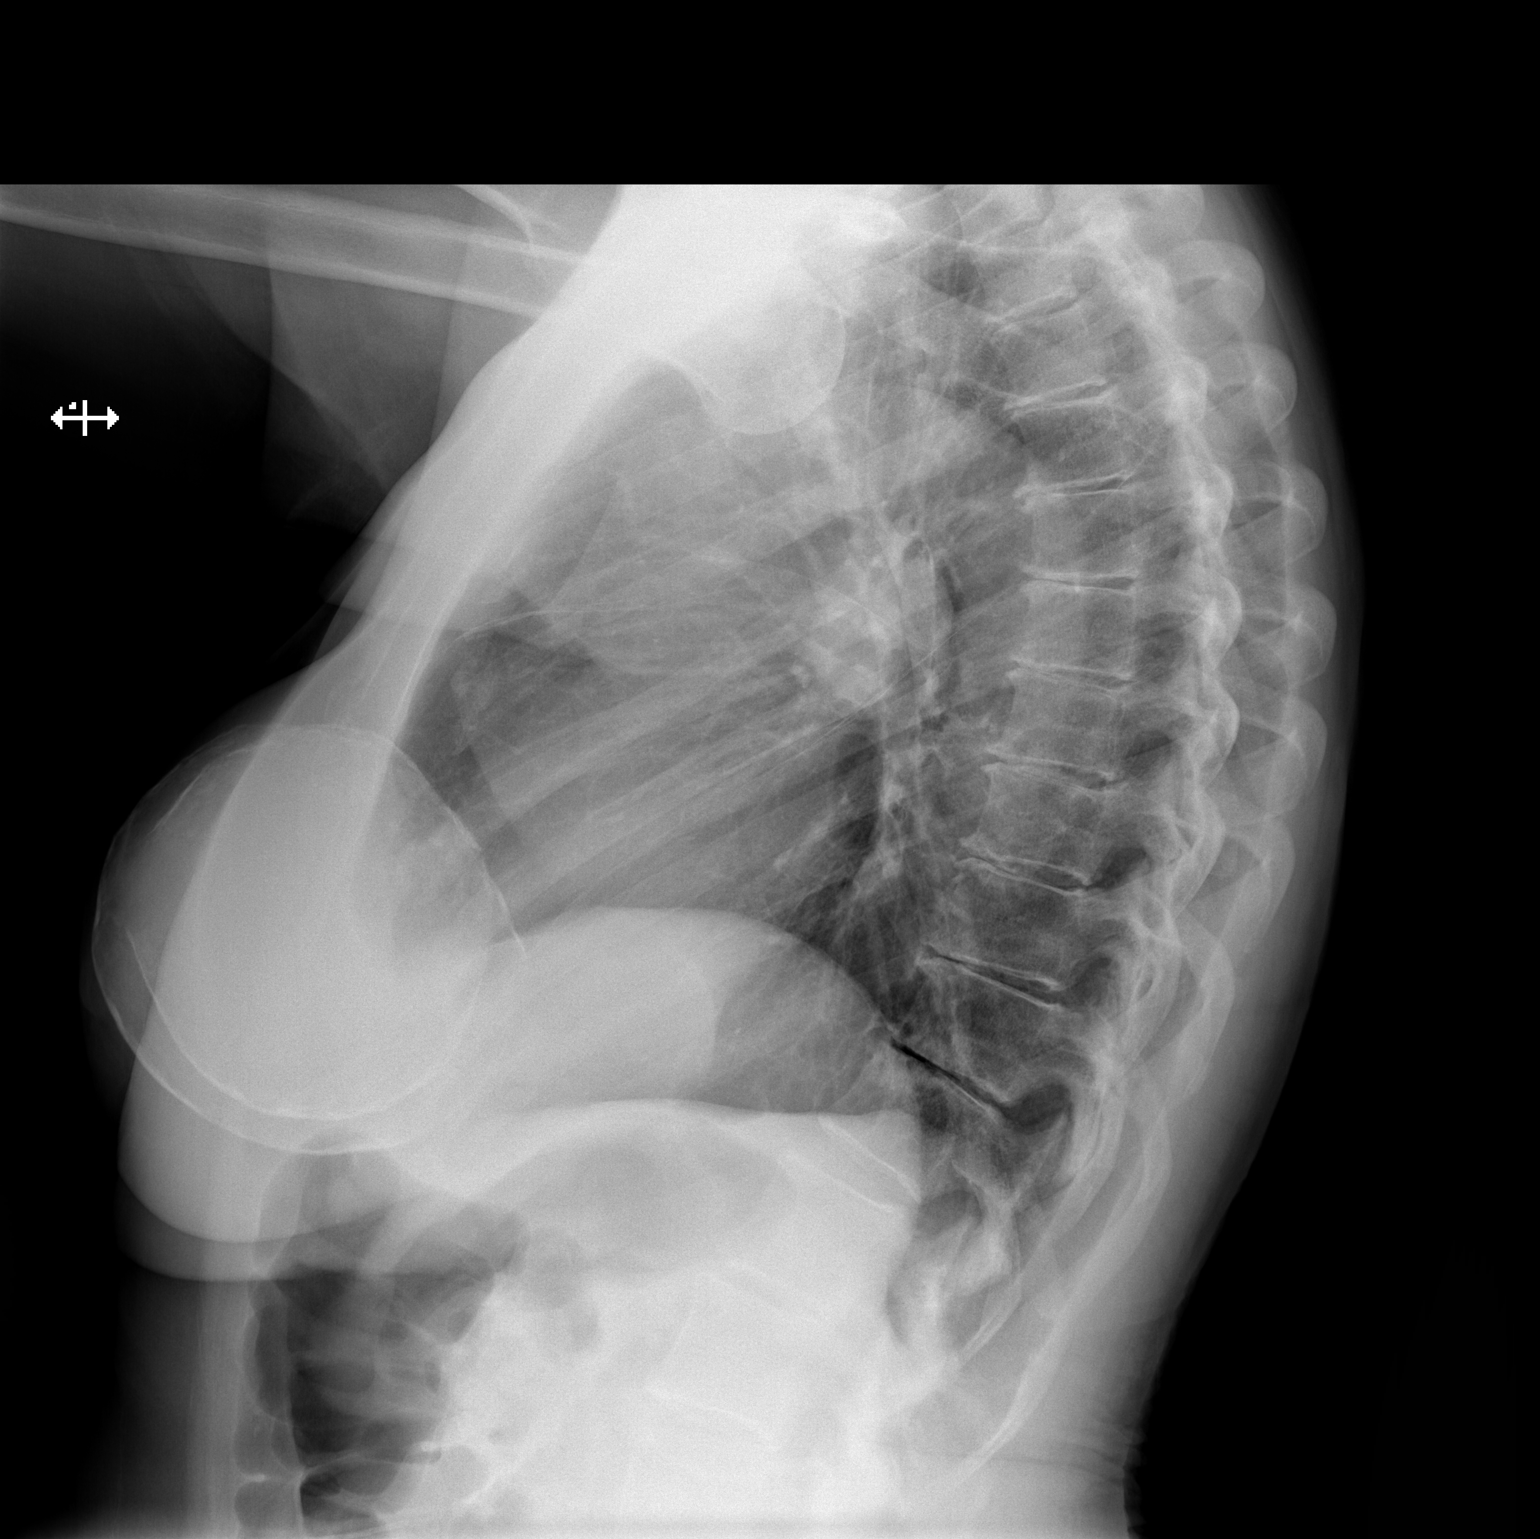

[2 of 2 positions shown; findings below may reference images not displayed]

FINDINGS: The heart size and mediastinal contours are within normal limits.
Both lungs are clear. The visualized skeletal structures are
unremarkable.
IMPRESSION: No active cardiopulmonary disease.

## 2019-12-23 NOTE — H&P (Signed)
  Subjective:     Patient ID: Rebecca Glenn is a 85 y.o. female.  Follow-up  Pre-op Exam   Here for follow up discussion prior to planned removal bilateral implants. Has known ruptured right breast implant, associated right chest pain. Underwent augmentation 1980s subglandular silicone with Dr. Charolotte Eke. Has developed pain over right lateral breast for over 7 months, has to wear bra at night for comfort. Reports concerns with mobility due to pain.  Last MMG 01/2019. 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 Per patient, was not told this until 2020.  Review of last available Dr. Joanna Puff chart with no implant information available.   Notes 40 lb weight loss over 4 years, husband passed followed by difficult cholecystectomy, CBD obstruction.  Lives with daughter.   Review of Systems     Objective:   Physical Exam  Cardiovascular: Normal rate, regular rhythm and normal heart sounds.  Pulmonary/Chest: Effort normal and breath sounds normal.   Baker 4 bilateral, waterfall deformitiy breast tissue over implant No palpable masses Bilateral IMF scars SN to nipple R 27.5 L 27 cm  Soft tissue pinch over implants R 3.5 cm L 4 cm    Assessment:     History breast augmentation Right extracapsular implant rupture    Plan:     Reviewed rupture and capsular contracture are not dangerous and does not require emergent surgery. However the CC may be source of new onset pain. Recommend removal bilateral implants with capsulectomies.   Reviewed GA, OP surgery or overnight stay, drains. Reviewed aesthetically would be significant change from present.  Additional risks including seroma, hematoma, damage to adjacent structures, blood clots in legs or lungs reviewed.  Discussed risk COVID infectionthrough this elective surgery. Patient will receive COVID testing prior to surgery. Discussed even if patient receivesa  negative test result, the tests in some cases may fail to detect the virus or patient maycontract COVID after the test.COVID 19 infectionbefore/during/aftersurgery may result in lead to a higher chance of complication and death.  Patient on pain contract- will contact this provider and will let me know at time of surgery if additional Rx needed.

## 2019-12-28 ENCOUNTER — Encounter (HOSPITAL_BASED_OUTPATIENT_CLINIC_OR_DEPARTMENT_OTHER): Payer: Self-pay | Admitting: Plastic Surgery

## 2019-12-28 ENCOUNTER — Other Ambulatory Visit: Payer: Self-pay

## 2019-12-31 ENCOUNTER — Other Ambulatory Visit (HOSPITAL_COMMUNITY)
Admission: RE | Admit: 2019-12-31 | Discharge: 2019-12-31 | Disposition: A | Discharge status: Home / Self Care | Payer: Medicare Other | Source: Ambulatory Visit | Attending: Plastic Surgery | Admitting: Plastic Surgery

## 2019-12-31 ENCOUNTER — Encounter (HOSPITAL_BASED_OUTPATIENT_CLINIC_OR_DEPARTMENT_OTHER)
Admission: RE | Admit: 2019-12-31 | Discharge: 2019-12-31 | Disposition: A | Discharge status: Home / Self Care | Payer: Medicare Other | Source: Ambulatory Visit | Attending: Plastic Surgery | Admitting: Plastic Surgery

## 2019-12-31 ENCOUNTER — Other Ambulatory Visit: Payer: Self-pay

## 2019-12-31 DIAGNOSIS — Z01812 Encounter for preprocedural laboratory examination: Secondary | ICD-10-CM | POA: Diagnosis not present

## 2019-12-31 DIAGNOSIS — Z20822 Contact with and (suspected) exposure to covid-19: Secondary | ICD-10-CM | POA: Insufficient documentation

## 2019-12-31 LAB — BASIC METABOLIC PANEL
Anion gap: 6 (ref 5–15)
BUN: 8 mg/dL (ref 8–23)
CO2: 31 mmol/L (ref 22–32)
Calcium: 9.4 mg/dL (ref 8.9–10.3)
Chloride: 99 mmol/L (ref 98–111)
Creatinine, Ser: 0.84 mg/dL (ref 0.44–1.00)
GFR calc Af Amer: >60 mL/min (ref >60)
GFR calc non Af Amer: >60 mL/min (ref >60)
Glucose, Bld: 117 mg/dL — ABNORMAL HIGH (ref 70–99)
Potassium: 4.5 mmol/L (ref 3.5–5.1)
Sodium: 136 mmol/L (ref 135–145)

## 2019-12-31 LAB — SARS CORONAVIRUS 2 (TAT 6-24 HRS): SARS Coronavirus 2: NEGATIVE

## 2019-12-31 NOTE — Progress Notes (Signed)

## 2020-01-04 ENCOUNTER — Ambulatory Visit (HOSPITAL_BASED_OUTPATIENT_CLINIC_OR_DEPARTMENT_OTHER): Payer: Medicare Other | Admitting: Anesthesiology

## 2020-01-04 ENCOUNTER — Encounter (HOSPITAL_BASED_OUTPATIENT_CLINIC_OR_DEPARTMENT_OTHER): Payer: Self-pay | Admitting: Plastic Surgery

## 2020-01-04 ENCOUNTER — Encounter (HOSPITAL_BASED_OUTPATIENT_CLINIC_OR_DEPARTMENT_OTHER)
Admission: RE | Disposition: A | Discharge status: Home / Self Care | Payer: Self-pay | Source: Home / Self Care | Attending: Plastic Surgery

## 2020-01-04 ENCOUNTER — Ambulatory Visit (HOSPITAL_BASED_OUTPATIENT_CLINIC_OR_DEPARTMENT_OTHER)
Admission: RE | Admit: 2020-01-04 | Discharge: 2020-01-04 | Disposition: A | Discharge status: Home / Self Care | Payer: Medicare Other | Attending: Plastic Surgery | Admitting: Plastic Surgery

## 2020-01-04 ENCOUNTER — Other Ambulatory Visit: Payer: Self-pay

## 2020-01-04 DIAGNOSIS — Y812 Prosthetic and other implants, materials and accessory general- and plastic-surgery devices associated with adverse incidents: Secondary | ICD-10-CM | POA: Insufficient documentation

## 2020-01-04 DIAGNOSIS — T8544XA Capsular contracture of breast implant, initial encounter: Secondary | ICD-10-CM | POA: Diagnosis not present

## 2020-01-04 DIAGNOSIS — I10 Essential (primary) hypertension: Secondary | ICD-10-CM | POA: Insufficient documentation

## 2020-01-04 DIAGNOSIS — T8543XA Leakage of breast prosthesis and implant, initial encounter: Secondary | ICD-10-CM | POA: Diagnosis not present

## 2020-01-04 DIAGNOSIS — Z87891 Personal history of nicotine dependence: Secondary | ICD-10-CM | POA: Diagnosis not present

## 2020-01-04 DIAGNOSIS — M1990 Unspecified osteoarthritis, unspecified site: Secondary | ICD-10-CM | POA: Insufficient documentation

## 2020-01-04 HISTORY — PX: BREAST IMPLANT REMOVAL: SHX5361

## 2020-01-04 SURGERY — REMOVAL, IMPLANT, BREAST
Anesthesia: General | Site: Breast | Laterality: Bilateral

## 2020-01-04 MED ORDER — DEXAMETHASONE SODIUM PHOSPHATE 10 MG/ML IJ SOLN
INTRAMUSCULAR | Status: AC
  Filled 2020-01-04: qty 1

## 2020-01-04 MED ORDER — BUPIVACAINE HCL (PF) 0.25 % IJ SOLN
INTRAMUSCULAR | Status: DC | PRN
  Administered 2020-01-04: 30 mL

## 2020-01-04 MED ORDER — PROPOFOL 500 MG/50ML IV EMUL
INTRAVENOUS | Status: DC | PRN
  Administered 2020-01-04: 25 ug/kg/min via INTRAVENOUS

## 2020-01-04 MED ORDER — FENTANYL CITRATE (PF) 100 MCG/2ML IJ SOLN
INTRAMUSCULAR | Status: DC | PRN
  Administered 2020-01-04: 50 ug via INTRAVENOUS
  Administered 2020-01-04: 25 ug via INTRAVENOUS

## 2020-01-04 MED ORDER — ACETAMINOPHEN 500 MG PO TABS
ORAL_TABLET | ORAL | Status: AC
  Filled 2020-01-04: qty 2

## 2020-01-04 MED ORDER — ACETAMINOPHEN 500 MG PO TABS
1000.0000 mg | ORAL_TABLET | ORAL | Status: AC
  Administered 2020-01-04: 1000 mg via ORAL

## 2020-01-04 MED ORDER — DEXAMETHASONE SODIUM PHOSPHATE 10 MG/ML IJ SOLN
INTRAMUSCULAR | Status: DC | PRN
  Administered 2020-01-04: 5 mg via INTRAVENOUS

## 2020-01-04 MED ORDER — LIDOCAINE 2% (20 MG/ML) 5 ML SYRINGE
INTRAMUSCULAR | Status: AC
  Filled 2020-01-04: qty 5

## 2020-01-04 MED ORDER — MIDAZOLAM HCL 2 MG/2ML IJ SOLN: 1.0000 mg | INTRAMUSCULAR | Status: DC | PRN

## 2020-01-04 MED ORDER — EPHEDRINE SULFATE 50 MG/ML IJ SOLN
INTRAMUSCULAR | Status: DC | PRN
  Administered 2020-01-04: 20 mg via INTRAVENOUS
  Administered 2020-01-04: 15 mg via INTRAVENOUS
  Administered 2020-01-04: 10 mg via INTRAVENOUS
  Administered 2020-01-04: 15 mg via INTRAVENOUS
  Administered 2020-01-04 (×2): 10 mg via INTRAVENOUS

## 2020-01-04 MED ORDER — FENTANYL CITRATE (PF) 100 MCG/2ML IJ SOLN: 50.0000 ug | INTRAMUSCULAR | Status: DC | PRN

## 2020-01-04 MED ORDER — 0.9 % SODIUM CHLORIDE (POUR BTL) OPTIME
TOPICAL | Status: DC | PRN
  Administered 2020-01-04: 08:00:00 600 mL

## 2020-01-04 MED ORDER — LIDOCAINE HCL (CARDIAC) PF 100 MG/5ML IV SOSY
PREFILLED_SYRINGE | INTRAVENOUS | Status: DC | PRN
  Administered 2020-01-04: 60 mg via INTRATRACHEAL

## 2020-01-04 MED ORDER — ONDANSETRON HCL 4 MG/2ML IJ SOLN
INTRAMUSCULAR | Status: DC | PRN
  Administered 2020-01-04: 4 mg via INTRAVENOUS

## 2020-01-04 MED ORDER — GABAPENTIN 300 MG PO CAPS
ORAL_CAPSULE | ORAL | Status: AC
  Filled 2020-01-04: qty 1

## 2020-01-04 MED ORDER — FENTANYL CITRATE (PF) 100 MCG/2ML IJ SOLN
INTRAMUSCULAR | Status: AC
  Filled 2020-01-04: qty 2

## 2020-01-04 MED ORDER — LACTATED RINGERS IV SOLN: INTRAVENOUS | Status: DC

## 2020-01-04 MED ORDER — FENTANYL CITRATE (PF) 100 MCG/2ML IJ SOLN: 25.0000 ug | INTRAMUSCULAR | Status: DC | PRN

## 2020-01-04 MED ORDER — PROPOFOL 10 MG/ML IV BOLUS
INTRAVENOUS | Status: AC
  Filled 2020-01-04: qty 40

## 2020-01-04 MED ORDER — CHLORHEXIDINE GLUCONATE CLOTH 2 % EX PADS: 6.0000 | MEDICATED_PAD | Freq: Once | CUTANEOUS | Status: DC

## 2020-01-04 MED ORDER — CEFAZOLIN SODIUM-DEXTROSE 2-4 GM/100ML-% IV SOLN
INTRAVENOUS | Status: AC
  Filled 2020-01-04: qty 100

## 2020-01-04 MED ORDER — CEFAZOLIN SODIUM-DEXTROSE 2-4 GM/100ML-% IV SOLN: 2.0000 g | INTRAVENOUS | Status: DC

## 2020-01-04 MED ORDER — GABAPENTIN 300 MG PO CAPS
300.0000 mg | ORAL_CAPSULE | ORAL | Status: AC
  Administered 2020-01-04: 300 mg via ORAL

## 2020-01-04 MED ORDER — OXYCODONE-ACETAMINOPHEN 10-325 MG PO TABS: 1.0000 | ORAL_TABLET | ORAL | 0 refills | Status: AC | PRN

## 2020-01-04 MED ORDER — ONDANSETRON HCL 4 MG/2ML IJ SOLN: 4.0000 mg | Freq: Once | INTRAMUSCULAR | Status: DC | PRN

## 2020-01-04 MED ORDER — KETOROLAC TROMETHAMINE 15 MG/ML IJ SOLN: 15.0000 mg | Freq: Once | INTRAMUSCULAR | Status: DC | PRN

## 2020-01-04 MED ORDER — EPHEDRINE SULFATE 50 MG/ML IJ SOLN
INTRAMUSCULAR | Status: DC | PRN
  Administered 2020-01-04: 10 mg via INTRAVENOUS

## 2020-01-04 MED ORDER — PROPOFOL 500 MG/50ML IV EMUL
INTRAVENOUS | Status: DC | PRN
  Administered 2020-01-04: 100 ug via INTRAVENOUS

## 2020-01-04 MED ORDER — ONDANSETRON HCL 4 MG/2ML IJ SOLN
INTRAMUSCULAR | Status: AC
  Filled 2020-01-04: qty 2

## 2020-01-04 MED ORDER — ACETAMINOPHEN 500 MG PO TABS: 1000.0000 mg | ORAL_TABLET | Freq: Once | ORAL | Status: DC

## 2020-01-04 MED ORDER — CEFAZOLIN SODIUM-DEXTROSE 2-3 GM-%(50ML) IV SOLR
INTRAVENOUS | Status: DC | PRN
  Administered 2020-01-04: 2 g via INTRAVENOUS

## 2020-01-04 MED ORDER — LACTATED RINGERS IV SOLN: INTRAVENOUS | Status: DC | PRN

## 2020-01-04 SURGICAL SUPPLY — 69 items
BAG DECANTER FOR FLEXI CONT (MISCELLANEOUS) IMPLANT
BINDER BREAST LRG (GAUZE/BANDAGES/DRESSINGS) IMPLANT
BINDER BREAST MEDIUM (GAUZE/BANDAGES/DRESSINGS) IMPLANT
BINDER BREAST XLRG (GAUZE/BANDAGES/DRESSINGS) IMPLANT
BINDER BREAST XXLRG (GAUZE/BANDAGES/DRESSINGS) IMPLANT
BLADE SURG 10 STRL SS (BLADE) ×2 IMPLANT
BLADE SURG 15 STRL LF DISP TIS (BLADE) IMPLANT
BLADE SURG 15 STRL SS (BLADE)
BNDG GAUZE ELAST 4 BULKY (GAUZE/BANDAGES/DRESSINGS) IMPLANT
CANISTER SUCT 1200ML W/VALVE (MISCELLANEOUS) ×2 IMPLANT
CHLORAPREP W/TINT 26 (MISCELLANEOUS) ×2 IMPLANT
COVER BACK TABLE 60X90IN (DRAPES) ×2 IMPLANT
COVER MAYO STAND STRL (DRAPES) ×2 IMPLANT
COVER WAND RF STERILE (DRAPES) IMPLANT
DECANTER SPIKE VIAL GLASS SM (MISCELLANEOUS) IMPLANT
DERMABOND ADVANCED (GAUZE/BANDAGES/DRESSINGS) ×1
DERMABOND ADVANCED .7 DNX12 (GAUZE/BANDAGES/DRESSINGS) ×1 IMPLANT
DRAIN CHANNEL 15F RND FF W/TCR (WOUND CARE) IMPLANT
DRAPE INCISE IOBAN 66X45 STRL (DRAPES) IMPLANT
DRAPE TOP ARMCOVERS (MISCELLANEOUS) ×2 IMPLANT
DRAPE U-SHAPE 76X120 STRL (DRAPES) ×2 IMPLANT
DRAPE UTILITY XL STRL (DRAPES) ×2 IMPLANT
DRSG PAD ABDOMINAL 8X10 ST (GAUZE/BANDAGES/DRESSINGS) ×4 IMPLANT
ELECT BLADE 4.0 EZ CLEAN MEGAD (MISCELLANEOUS) ×2
ELECT BLADE 6.5 EXT (BLADE) IMPLANT
ELECT COATED BLADE 2.86 ST (ELECTRODE) ×2 IMPLANT
ELECT REM PT RETURN 9FT ADLT (ELECTROSURGICAL) ×2
ELECTRODE BLDE 4.0 EZ CLN MEGD (MISCELLANEOUS) ×1 IMPLANT
ELECTRODE REM PT RTRN 9FT ADLT (ELECTROSURGICAL) ×1 IMPLANT
EVACUATOR SILICONE 100CC (DRAIN) IMPLANT
GAUZE SPONGE 4X4 12PLY STRL (GAUZE/BANDAGES/DRESSINGS) IMPLANT
GAUZE SPONGE 4X4 12PLY STRL LF (GAUZE/BANDAGES/DRESSINGS) IMPLANT
GLOVE BIO SURGEON STRL SZ 6 (GLOVE) ×6 IMPLANT
GLOVE BIO SURGEON STRL SZ7 (GLOVE) ×6 IMPLANT
GLOVE BIOGEL PI IND STRL 7.0 (GLOVE) ×2 IMPLANT
GLOVE BIOGEL PI IND STRL 7.5 (GLOVE) ×1 IMPLANT
GLOVE BIOGEL PI INDICATOR 7.0 (GLOVE) ×2
GLOVE BIOGEL PI INDICATOR 7.5 (GLOVE) ×1
GOWN STRL REUS W/ TWL LRG LVL3 (GOWN DISPOSABLE) ×1 IMPLANT
GOWN STRL REUS W/ TWL XL LVL3 (GOWN DISPOSABLE) ×1 IMPLANT
GOWN STRL REUS W/TWL LRG LVL3 (GOWN DISPOSABLE) ×1
GOWN STRL REUS W/TWL XL LVL3 (GOWN DISPOSABLE) ×1
MARKER SKIN DUAL TIP RULER LAB (MISCELLANEOUS) IMPLANT
NEEDLE HYPO 25X1 1.5 SAFETY (NEEDLE) ×2 IMPLANT
NS IRRIG 1000ML POUR BTL (IV SOLUTION) ×2 IMPLANT
PACK BASIN DAY SURGERY FS (CUSTOM PROCEDURE TRAY) ×2 IMPLANT
PENCIL SMOKE EVACUATOR (MISCELLANEOUS) ×2 IMPLANT
PIN SAFETY STERILE (MISCELLANEOUS) IMPLANT
SHEET MEDIUM DRAPE 40X70 STRL (DRAPES) ×2 IMPLANT
SLEEVE SCD COMPRESS KNEE MED (MISCELLANEOUS) ×2 IMPLANT
SPONGE LAP 18X18 RF (DISPOSABLE) ×4 IMPLANT
STAPLER VISISTAT 35W (STAPLE) ×2 IMPLANT
STRIP CLOSURE SKIN 1/2X4 (GAUZE/BANDAGES/DRESSINGS) IMPLANT
SUT ETHILON 2 0 FS 18 (SUTURE) IMPLANT
SUT MNCRL AB 4-0 PS2 18 (SUTURE) ×4 IMPLANT
SUT VIC AB 3-0 PS1 18 (SUTURE) ×2
SUT VIC AB 3-0 PS1 18XBRD (SUTURE) ×2 IMPLANT
SUT VIC AB 3-0 SH 27 (SUTURE)
SUT VIC AB 3-0 SH 27X BRD (SUTURE) IMPLANT
SUT VICRYL 4-0 PS2 18IN ABS (SUTURE) ×2 IMPLANT
SWAB COLLECTION DEVICE MRSA (MISCELLANEOUS) IMPLANT
SWAB CULTURE ESWAB REG 1ML (MISCELLANEOUS) IMPLANT
SYR 50ML LL SCALE MARK (SYRINGE) IMPLANT
SYR BULB IRRIGATION 50ML (SYRINGE) ×2 IMPLANT
SYR CONTROL 10ML LL (SYRINGE) ×2 IMPLANT
TOWEL GREEN STERILE FF (TOWEL DISPOSABLE) ×4 IMPLANT
TUBE CONNECTING 20X1/4 (TUBING) ×4 IMPLANT
UNDERPAD 30X36 HEAVY ABSORB (UNDERPADS AND DIAPERS) ×4 IMPLANT
YANKAUER SUCT BULB TIP NO VENT (SUCTIONS) ×2 IMPLANT

## 2020-01-04 NOTE — Transfer of Care (Signed)
Immediate Anesthesia Transfer of Care Note  Patient: Rebecca Glenn  Procedure(s) Performed: REMOVAL BILATERAL BREAST IMPLANTS WITH CAPSULECTOMIES, REMOVAL RIGHT BREAST IMPLANT MATERIAL (Bilateral Breast)  Patient Location: PACU  Anesthesia Type:General  Level of Consciousness: awake, alert  and oriented  Airway & Oxygen Therapy: Patient Spontanous Breathing and Patient connected to face mask oxygen  Post-op Assessment: Report given to RN and Post -op Vital signs reviewed and stable  Post vital signs: Reviewed and stable  Last Vitals:  Vitals Value Taken Time  BP    Temp    Pulse 72 01/04/20 0857  Resp 15 01/04/20 0857  SpO2 100 % 01/04/20 0857  Vitals shown include unvalidated device data.  Last Pain:  Vitals:   01/04/20 0648  TempSrc: Tympanic  PainSc: 0-No pain      Patients Stated Pain Goal: 4 (01/04/20 4259)  Complications: No apparent anesthesia complications

## 2020-01-04 NOTE — Discharge Instructions (Signed)
No Tylenol until 1PM if needed   Post Anesthesia Home Care Instructions  Activity: Get plenty of rest for the remainder of the day. A responsible individual must stay with you for 24 hours following the procedure.  For the next 24 hours, DO NOT: -Drive a car -Advertising copywriter -Drink alcoholic beverages -Take any medication unless instructed by your physician -Make any legal decisions or sign important papers.  Meals: Start with liquid foods such as gelatin or soup. Progress to regular foods as tolerated. Avoid greasy, spicy, heavy foods. If nausea and/or vomiting occur, drink only clear liquids until the nausea and/or vomiting subsides. Call your physician if vomiting continues.  Special Instructions/Symptoms: Your throat may feel dry or sore from the anesthesia or the breathing tube placed in your throat during surgery. If this causes discomfort, gargle with warm salt water. The discomfort should disappear within 24 hours.  If you had a scopolamine patch placed behind your ear for the management of post- operative nausea and/or vomiting:  1. The medication in the patch is effective for 72 hours, after which it should be removed.  Wrap patch in a tissue and discard in the trash. Wash hands thoroughly with soap and water. 2. You may remove the patch earlier than 72 hours if you experience unpleasant side effects which may include dry mouth, dizziness or visual disturbances. 3. Avoid touching the patch. Wash your hands with soap and water after contact with the patch.        JP Drain [employer] this sheet to all of your post-operative appointments while you have your drains.  Please measure your drains by CC's or ML's.  Make sure you drain and measure your JP Drains 2 or 3 times per day.  At the end of each day, add up totals for the left side and add up totals for the right side.    ( 9 am )     ( 3 pm )        ( 9 pm )                Date L  R  L  R  L  R  Total L/R

## 2020-01-04 NOTE — Interval H&P Note (Signed)
History and Physical Interval Note:  01/04/2020 6:58 AM  Rebecca Glenn  has presented today for surgery, with the diagnosis of RIGHT BREAST MECHANICAL COMPLICATION, HISTORY AUGMENTATION MAMMAPLASTY, BILATERAL CAPSULAR CONTRACTURE.  The various methods of treatment have been discussed with the patient and family. After consideration of risks, benefits and other options for treatment, the patient has consented to  Procedure(s): REMOVAL BILATERAL BREAST IMPLANTS WITH CAPSULECTOMIES, REMOVAL RIGHT BREAST IMPLANT MATERIAL (Bilateral) as a surgical intervention.  The patient's history has been reviewed, patient examined, no change in status, stable for surgery.  I have reviewed the patient's chart and labs.  Questions were answered to the patient's satisfaction.     Rebecca Glenn

## 2020-01-04 NOTE — Anesthesia Preprocedure Evaluation (Addendum)
Anesthesia Evaluation  Patient identified by MRN, date of birth, ID band Patient awake    Reviewed: Allergy & Precautions, NPO status , Patient's Chart, lab work & pertinent test results  History of Anesthesia Complications Negative for: history of anesthetic complications  Airway Mallampati: I  TM Distance: >3 FB Neck ROM: Full    Dental no notable dental hx. (+) Teeth Intact, Dental Advisory Given   Pulmonary Patient abstained from smoking., former smoker,    Pulmonary exam normal        Cardiovascular hypertension, Normal cardiovascular exam  EKG 12/31/19: sinus brady at 54bpm   Neuro/Psych  Headaches, PSYCHIATRIC DISORDERS Depression    GI/Hepatic GERD  Controlled and Medicated,  Endo/Other  Hypothyroidism   Renal/GU   negative genitourinary   Musculoskeletal  (+) Arthritis , Osteoarthritis,    Abdominal Normal abdominal exam  (+)   Peds  Hematology   Anesthesia Other Findings Bilateral capsular contracture s/p augmentation  Reproductive/Obstetrics negative OB ROS                            Anesthesia Physical  Anesthesia Plan  ASA: II  Anesthesia Plan: General   Post-op Pain Management:    Induction: Intravenous  PONV Risk Score and Plan: 3 and Ondansetron, Dexamethasone and Treatment may vary due to age or medical condition  Airway Management Planned: LMA  Additional Equipment: None  Intra-op Plan:   Post-operative Plan: Extubation in OR  Informed Consent: I have reviewed the patients History and Physical, chart, labs and discussed the procedure including the risks, benefits and alternatives for the proposed anesthesia with the patient or authorized representative who has indicated his/her understanding and acceptance.     Dental advisory given  Plan Discussed with: CRNA  Anesthesia Plan Comments:         Anesthesia Quick Evaluation

## 2020-01-04 NOTE — Anesthesia Postprocedure Evaluation (Signed)
Anesthesia Post Note  Patient: Glass blower/designer  Procedure(s) Performed: REMOVAL BILATERAL BREAST IMPLANTS WITH CAPSULECTOMIES, REMOVAL RIGHT BREAST IMPLANT MATERIAL (Bilateral Breast)     Patient location during evaluation: PACU Anesthesia Type: General Level of consciousness: awake and alert, oriented and patient cooperative Pain management: pain level controlled Vital Signs Assessment: post-procedure vital signs reviewed and stable Respiratory status: spontaneous breathing, nonlabored ventilation and respiratory function stable Cardiovascular status: blood pressure returned to baseline and stable Postop Assessment: no apparent nausea or vomiting Anesthetic complications: no    Last Vitals:  Vitals:   01/04/20 0900 01/04/20 0915  BP: 122/78 (!) 145/71  Pulse: 71 70  Resp: 15 18  Temp:    SpO2: 100% 96%    Last Pain:  Vitals:   01/04/20 0915  TempSrc:   PainSc: 0-No pain                 Lannie Fields

## 2020-01-04 NOTE — Op Note (Signed)
Operative Note   DATE OF OPERATION: 2.2.21  LOCATION: [facility]-outpatient  SURGICAL DIVISION: Plastic Surgery  PREOPERATIVE DIAGNOSES:  1. Right intracapsular rupture implant 2. Bilateral capsular contracture 3. History augmentation mammaplasty  POSTOPERATIVE DIAGNOSES:  1. Bilateral intracapsular rupture implants 2. Bilateral capsular contracture 3. History augmentation mammaplasty  PROCEDURE:  Removal bilateral breast implants with complete capsectomies  SURGEON: Glenna Fellows MD MBA  ASSISTANT: none  ANESTHESIA:  General.   EBL: <50 ml  COMPLICATIONS: None immediate.   INDICATIONS FOR PROCEDURE:  The patient, Rebecca Glenn, is a 85 y.o. female born on 06-18-1935, is here for removal breast implants with known right implant rupture, bilateral contracture, and right chest pain.   FINDINGS: Smooth silicone implants removed bilateral with know implant stamp present. Thick calcified capsule noted bilateral without masses or seroma. Bilateral intracapsular rupture implants noted.  DESCRIPTION OF PROCEDURE:  The patient's operative site was marked with the patient in the preoperative area. The patient was taken to the operating room. SCDs were placed and IV antibiotics were given. The patient's operative site was prepped and draped in a sterile fashion. A time out was performed and all information was confirmed to be correct. Incision made in left inframammary fold scar and carried through superficial fascia to surface implant capsule. Dissection completed over entirety capsule and implant and capsule removed together. Capsule incised following removal and rupture of implant noted. Cavity irrigated and hemostasis ensured. Local anesthetic infiltrated.  I then directed attention to right breast.  Incision made in right inframammary fold scar and carried through superficial fascia to surface implant capsule. Dissection completed over entirety capsule and implant and capsule  removed together. Cavity irrigated and hemostasis ensured. Local anesthetic infiltrated.  15 Fr JP placed in each breast and secured with 2-0 nylon. Closure completed with 3-0 vicryl to approximated superficial fascia, 4-0 vicryl in dermis, and 4-0 monocryl subcuticular closure bilateral. Tissue adhesive applied. Dry dressing and breast binder applied.  The patient was allowed to wake from anesthesia, extubated and taken to the recovery room in satisfactory condition.   SPECIMENS: right and left implant and capsule  DRAINS: 15 Fr JP in right and left breast

## 2020-01-04 NOTE — Anesthesia Procedure Notes (Signed)
Procedure Name: LMA Insertion Performed by: Bren Steers M, CRNA Pre-anesthesia Checklist: Patient identified, Emergency Drugs available, Suction available and Patient being monitored Patient Re-evaluated:Patient Re-evaluated prior to induction Oxygen Delivery Method: Circle system utilized Preoxygenation: Pre-oxygenation with 100% oxygen Induction Type: IV induction Ventilation: Mask ventilation without difficulty LMA: LMA inserted LMA Size: 4.0 Number of attempts: 1 Airway Equipment and Method: Bite block Placement Confirmation: positive ETCO2 Tube secured with: Tape Dental Injury: Teeth and Oropharynx as per pre-operative assessment        

## 2020-01-05 ENCOUNTER — Encounter: Payer: Self-pay | Admitting: *Deleted

## 2020-01-05 LAB — SURGICAL PATHOLOGY

## 2021-10-16 ENCOUNTER — Other Ambulatory Visit: Payer: Self-pay | Admitting: Neurological Surgery

## 2021-11-01 ENCOUNTER — Other Ambulatory Visit: Payer: Self-pay | Admitting: Neurological Surgery

## 2021-11-02 NOTE — Progress Notes (Signed)
Surgical Instructions    Your procedure is scheduled on Thursday, December 8th, 2022.   Report to [facility] Main Entrance "A" at 05:30 A.M., then check in with the Admitting office.  Call this number if you have problems the morning of surgery:  [phone]   If you have any questions prior to your surgery date call [phone]: Open Monday-Friday 8am-4pm    Remember:  Do not eat after midnight the night before your surgery  You may drink clear liquids until 04:30 the morning of your surgery.   Clear liquids allowed are: Water, Non-Citrus Juices (without pulp), Carbonated Beverages, Clear Tea, Black Coffee ONLY (NO MILK, CREAM OR POWDERED CREAMER of any kind), and Gatorade    Take these medicines the morning of surgery with A SIP OF WATER:  buPROPion (WELLBUTRIN XL)  gabapentin (NEURONTIN)  oxyCODONE-acetaminophen (PERCOCET)  propranolol ER (INDERAL LA) RABEprazole (ACIPHEX)   If needed:   methocarbamol (ROBAXIN)    As of today, STOP taking any Aspirin (unless otherwise instructed by your surgeon) Aleve, Naproxen, Ibuprofen, Motrin, Advil, Goody's, BC's, all herbal medications, fish oil, and all vitamins.    After your COVID test   You are not required to quarantine however you are required to wear a well-fitting mask when you are out and around people not in your household.  If your mask becomes wet or soiled, replace with a new one.  Wash your hands often with soap and water for 20 seconds or clean your hands with an alcohol-based hand sanitizer that contains at least 60% alcohol.  Do not share personal items.  Notify your provider: if you are in close contact with someone who has COVID  or if you develop a fever of 100.4 or greater, sneezing, cough, sore throat, shortness of breath or body aches.    The day of surgery:          Do not wear jewelry or makeup Do not wear lotions, powders, perfumes, or deodorant. Do not shave 48 hours prior to surgery.   Do not  bring valuables to the hospital. DO Not wear nail polish, gel polish, artificial nails, or any other type of covering on natural nails including finger and toenails. If patients have artificial nails, gel coating, etc. that need to be removed by a nail salon, please have this removed prior to surgery or surgery may need to be canceled/delayed if the surgeon/ anesthesia feels like the patient is unable to be adequately monitored.               [place] is not responsible for any belongings or valuables.  Do NOT Smoke (Tobacco/Vaping)  24 hours prior to your procedure  If you use a CPAP at night, you may bring your mask for your overnight stay.   Contacts, glasses, hearing aids, dentures or partials may not be worn into surgery, please bring cases for these belongings   For patients admitted to the hospital, discharge time will be determined by your treatment team.   Patients discharged the day of surgery will not be allowed to drive home, and someone needs to stay with them for 24 hours.  NO VISITORS WILL BE ALLOWED IN PRE-OP WHERE PATIENTS ARE PREPPED FOR SURGERY.  ONLY 1 SUPPORT PERSON MAY BE PRESENT IN THE WAITING ROOM WHILE YOU ARE IN SURGERY.  IF YOU ARE TO BE ADMITTED, ONCE YOU ARE IN YOUR ROOM YOU WILL BE ALLOWED TWO (2) VISITORS. 1 (ONE) VISITOR MAY STAY OVERNIGHT BUT MUST ARRIVE  TO THE ROOM BY 8pm.  Minor children may have two parents present. Special consideration for safety and communication needs will be reviewed on a case by case basis.  Special instructions:    Oral Hygiene is also important to reduce your risk of infection.  Remember - BRUSH YOUR TEETH THE MORNING OF SURGERY WITH YOUR REGULAR TOOTHPASTE   [place]- Preparing For Surgery  Before surgery, you can play an important role. Because skin is not sterile, your skin needs to be as free of germs as possible. You can reduce the number of germs on your skin by washing with CHG (chlorahexidine gluconate) Soap before  surgery.  CHG is an antiseptic cleaner which kills germs and bonds with the skin to continue killing germs even after washing.     Please do not use if you have an allergy to CHG or antibacterial soaps. If your skin becomes reddened/irritated stop using the CHG.  Do not shave (including legs and underarms) for at least 48 hours prior to first CHG shower. It is OK to shave your face.  Please follow these instructions carefully.     Shower the NIGHT BEFORE SURGERY and the MORNING OF SURGERY with CHG Soap.   If you chose to wash your hair, wash your hair first as usual with your normal shampoo. After you shampoo, rinse your hair and body thoroughly to remove the shampoo.  Then [employer] and genitals (private parts) with your normal soap and rinse thoroughly to remove soap.  After that Use CHG Soap as you would any other liquid soap. You can apply CHG directly to the skin and wash gently with a scrungie or a clean washcloth.   Apply the CHG Soap to your body ONLY FROM THE NECK DOWN.  Do not use on open wounds or open sores. Avoid contact with your eyes, ears, mouth and genitals (private parts). Wash Face and genitals (private parts)  with your normal soap.   Wash thoroughly, paying special attention to the area where your surgery will be performed.  Thoroughly rinse your body with warm water from the neck down.  DO NOT shower/wash with your normal soap after using and rinsing off the CHG Soap.  Pat yourself dry with a CLEAN TOWEL.  Wear CLEAN PAJAMAS to bed the night before surgery  Place CLEAN SHEETS on your bed the night before your surgery  DO NOT SLEEP WITH PETS.   Day of Surgery:  Take a shower with CHG soap. Wear Clean/Comfortable clothing the morning of surgery Do not apply any deodorants/lotions.   Remember to brush your teeth WITH YOUR REGULAR TOOTHPASTE.   Please read over the following fact sheets that you were given.

## 2021-11-05 ENCOUNTER — Other Ambulatory Visit: Payer: Self-pay

## 2021-11-05 ENCOUNTER — Encounter (HOSPITAL_COMMUNITY): Payer: Self-pay

## 2021-11-05 ENCOUNTER — Encounter (HOSPITAL_COMMUNITY)
Admission: RE | Admit: 2021-11-05 | Discharge: 2021-11-05 | Disposition: A | Discharge status: Home / Self Care | Payer: Medicare Other | Source: Ambulatory Visit | Attending: Neurological Surgery | Admitting: Neurological Surgery

## 2021-11-05 VITALS — BP 124/59 | HR 52 | Temp 97.7°F | Resp 18 | Ht 67.0 in | Wt 168.9 lb

## 2021-11-05 DIAGNOSIS — Z01818 Encounter for other preprocedural examination: Secondary | ICD-10-CM | POA: Insufficient documentation

## 2021-11-05 DIAGNOSIS — R001 Bradycardia, unspecified: Secondary | ICD-10-CM | POA: Diagnosis not present

## 2021-11-05 DIAGNOSIS — Z20822 Contact with and (suspected) exposure to covid-19: Secondary | ICD-10-CM | POA: Insufficient documentation

## 2021-11-05 LAB — SURGICAL PCR SCREEN
MRSA, PCR: NEGATIVE
Staphylococcus aureus: NEGATIVE

## 2021-11-05 LAB — CBC
HCT: 45.2 % (ref 36.0–46.0)
Hemoglobin: 15 g/dL (ref 12.0–15.0)
MCH: 33.2 pg (ref 26.0–34.0)
MCHC: 33.2 g/dL (ref 30.0–36.0)
MCV: 100 fL (ref 80.0–100.0)
Platelets: 242 10*3/uL (ref 150–400)
RBC: 4.52 MIL/uL (ref 3.87–5.11)
RDW: 11.4 % — ABNORMAL LOW (ref 11.5–15.5)
WBC: 7.9 10*3/uL (ref 4.0–10.5)
nRBC: 0 % (ref 0.0–0.2)

## 2021-11-05 LAB — SARS CORONAVIRUS 2 (TAT 6-24 HRS): SARS Coronavirus 2: NEGATIVE

## 2021-11-05 NOTE — Progress Notes (Addendum)
PCP - Dina Rich, MD Cardiologist - denies  PPM/ICD - denies Device Orders - n/a Rep Notified - n/a  Chest x-ray - n/a EKG - 11/05/2021 Stress Test - denies ECHO - denies Cardiac Cath - denies  Sleep Study - denies CPAP - n/a  Fasting Blood Sugar - n/a  Blood Thinner Instructions: n/a  Aspirin Instructions: Patient was instructed: As of today, STOP taking any Aspirin (unless otherwise instructed by your surgeon) Aleve, Naproxen, Ibuprofen, Motrin, Advil, Goody's, BC's, all herbal medications, fish oil, and all vitamins.  ERAS Protcol - yes PRE-SURGERY Ensure or G2- no  COVID TEST- yes, done in PAT on 11/05/2021   Anesthesia review: yes - anesthesia complication - sever pain in lower back after spinal block 25 years ago   Patient denies shortness of breath, fever, cough and chest pain at PAT appointment   All instructions explained to the patient, with a verbal understanding of the material. Patient agrees to go over the instructions while at home for a better understanding. Patient also instructed to self quarantine after being tested for COVID-19. The opportunity to ask questions was provided.

## 2021-11-06 NOTE — Progress Notes (Signed)
Anesthesia Chart Review:  Patient seen by PCP Dr. Sol Passer 10/10/21 for preop eval. Per note, "Pre-operative Assessment - planned procedure: lumbar decompression - planned date: TBD - ACC 2014 Revised Cardiac Risk Index: Class I Risk, 0.4% Risk of Major Cardiac Event - ACC Perioperative Assessment: No Further Testing (Class III:NB) - Proceed to surgery with routine assessments and post-operative care." CMP 10/10/2021 in Care Everywhere reviewed, sodium mildly low at 133, otherwise unremarkable.  CBC 11/05/2021 reviewed, unremarkable.  EKG 11/05/2021: Sinus bradycardia.  Rate 48.  Low-voltage QRS.   Zannie Cove [facility] Short Stay Center/Anesthesiology Phone [phone] 11/06/2021 2:48 PM

## 2021-11-06 NOTE — Anesthesia Preprocedure Evaluation (Addendum)
Anesthesia Evaluation  Patient identified by MRN, date of birth, ID band Patient awake    Reviewed: Allergy & Precautions, NPO status , Patient's Chart, lab work & pertinent test results  Airway Mallampati: II       Dental   Pulmonary Patient abstained from smoking., former smoker,    breath sounds clear to auscultation       Cardiovascular hypertension,  Rhythm:Regular Rate:Normal     Neuro/Psych  Headaches,    GI/Hepatic Neg liver ROS, GERD  ,  Endo/Other  Hypothyroidism   Renal/GU      Musculoskeletal   Abdominal   Peds  Hematology   Anesthesia Other Findings   Reproductive/Obstetrics                            Anesthesia Physical Anesthesia Plan  ASA: 3  Anesthesia Plan: General   Post-op Pain Management:    Induction: Intravenous  PONV Risk Score and Plan: 3 and Ondansetron, Dexamethasone and Midazolam  Airway Management Planned: Oral ETT  Additional Equipment:   Intra-op Plan:   Post-operative Plan: Extubation in OR  Informed Consent: I have reviewed the patients History and Physical, chart, labs and discussed the procedure including the risks, benefits and alternatives for the proposed anesthesia with the patient or authorized representative who has indicated his/her understanding and acceptance.     Dental advisory given  Plan Discussed with: CRNA and Anesthesiologist  Anesthesia Plan Comments: (PAT note by Antionette Poles, PA-C: Patient seen by PCP Dr. Sol Passer 10/10/21 for preop eval. Per note, "Pre-operative Assessment - planned procedure: lumbar decompression - planned date: TBD - ACC 2014 Revised Cardiac Risk Index: Class I Risk, 0.4% Risk of Major Cardiac Event - ACC Perioperative Assessment: No Further Testing (Class III:NB) - Proceed to surgery with routine assessments and post-operative care." CMP 10/10/2021 in Care Everywhere reviewed, sodium mildly low at 133,  otherwise unremarkable.  CBC 11/05/2021 reviewed, unremarkable.  EKG 11/05/2021: Sinus bradycardia.  Rate 48.  Low-voltage QRS.  )       Anesthesia Quick Evaluation

## 2021-11-08 ENCOUNTER — Ambulatory Visit (HOSPITAL_COMMUNITY): Payer: Medicare Other | Admitting: Physician Assistant

## 2021-11-08 ENCOUNTER — Ambulatory Visit (HOSPITAL_COMMUNITY): Payer: Medicare Other

## 2021-11-08 ENCOUNTER — Encounter (HOSPITAL_COMMUNITY): Payer: Self-pay | Admitting: Neurological Surgery

## 2021-11-08 ENCOUNTER — Ambulatory Visit (HOSPITAL_COMMUNITY): Payer: Medicare Other | Admitting: Certified Registered"

## 2021-11-08 ENCOUNTER — Observation Stay (HOSPITAL_COMMUNITY)
Admission: RE | Admit: 2021-11-08 | Discharge: 2021-11-09 | Disposition: A | Discharge status: Home / Self Care | Payer: Medicare Other | Attending: Neurological Surgery | Admitting: Neurological Surgery

## 2021-11-08 ENCOUNTER — Encounter (HOSPITAL_COMMUNITY)
Admission: RE | Disposition: A | Discharge status: Home / Self Care | Payer: Self-pay | Source: Home / Self Care | Attending: Neurological Surgery

## 2021-11-08 ENCOUNTER — Other Ambulatory Visit: Payer: Self-pay

## 2021-11-08 DIAGNOSIS — Z419 Encounter for procedure for purposes other than remedying health state, unspecified: Secondary | ICD-10-CM

## 2021-11-08 DIAGNOSIS — I10 Essential (primary) hypertension: Secondary | ICD-10-CM | POA: Insufficient documentation

## 2021-11-08 DIAGNOSIS — E039 Hypothyroidism, unspecified: Secondary | ICD-10-CM | POA: Diagnosis not present

## 2021-11-08 DIAGNOSIS — Z79899 Other long term (current) drug therapy: Secondary | ICD-10-CM | POA: Insufficient documentation

## 2021-11-08 DIAGNOSIS — M48062 Spinal stenosis, lumbar region with neurogenic claudication: Secondary | ICD-10-CM | POA: Diagnosis present

## 2021-11-08 DIAGNOSIS — Z96653 Presence of artificial knee joint, bilateral: Secondary | ICD-10-CM | POA: Insufficient documentation

## 2021-11-08 DIAGNOSIS — Z87891 Personal history of nicotine dependence: Secondary | ICD-10-CM | POA: Diagnosis not present

## 2021-11-08 HISTORY — PX: LUMBAR LAMINECTOMY/DECOMPRESSION MICRODISCECTOMY: SHX5026

## 2021-11-08 SURGERY — LUMBAR LAMINECTOMY/DECOMPRESSION MICRODISCECTOMY 1 LEVEL
Anesthesia: General | Site: Spine Lumbar

## 2021-11-08 MED ORDER — 0.9 % SODIUM CHLORIDE (POUR BTL) OPTIME
TOPICAL | Status: DC | PRN
  Administered 2021-11-08: 1000 mL

## 2021-11-08 MED ORDER — LIDOCAINE-EPINEPHRINE 1 %-1:100000 IJ SOLN
INTRAMUSCULAR | Status: AC
  Filled 2021-11-08: qty 1

## 2021-11-08 MED ORDER — DEXAMETHASONE SODIUM PHOSPHATE 10 MG/ML IJ SOLN
INTRAMUSCULAR | Status: AC
  Filled 2021-11-08: qty 1

## 2021-11-08 MED ORDER — PROPOFOL 10 MG/ML IV BOLUS
INTRAVENOUS | Status: DC | PRN
  Administered 2021-11-08: 150 mg via INTRAVENOUS

## 2021-11-08 MED ORDER — MUPIROCIN 2 % EX OINT
TOPICAL_OINTMENT | CUTANEOUS | Status: AC
  Filled 2021-11-08: qty 22

## 2021-11-08 MED ORDER — SODIUM CHLORIDE 0.9% FLUSH
3.0000 mL | Freq: Two times a day (BID) | INTRAVENOUS | Status: DC
  Administered 2021-11-08 (×2): 3 mL via INTRAVENOUS

## 2021-11-08 MED ORDER — ROCURONIUM BROMIDE 10 MG/ML (PF) SYRINGE
PREFILLED_SYRINGE | INTRAVENOUS | Status: DC | PRN
  Administered 2021-11-08: 60 mg via INTRAVENOUS

## 2021-11-08 MED ORDER — CHLORHEXIDINE GLUCONATE CLOTH 2 % EX PADS: 6.0000 | MEDICATED_PAD | Freq: Once | CUTANEOUS | Status: DC

## 2021-11-08 MED ORDER — ACETAMINOPHEN 650 MG RE SUPP: 650.0000 mg | RECTAL | Status: DC | PRN

## 2021-11-08 MED ORDER — LIDOCAINE 2% (20 MG/ML) 5 ML SYRINGE
INTRAMUSCULAR | Status: DC | PRN
  Administered 2021-11-08: 60 mg via INTRAVENOUS

## 2021-11-08 MED ORDER — FENTANYL CITRATE (PF) 250 MCG/5ML IJ SOLN
INTRAMUSCULAR | Status: DC | PRN
  Administered 2021-11-08: 50 ug via INTRAVENOUS

## 2021-11-08 MED ORDER — ONDANSETRON HCL 4 MG/2ML IJ SOLN: 4.0000 mg | Freq: Four times a day (QID) | INTRAMUSCULAR | Status: DC | PRN

## 2021-11-08 MED ORDER — ORAL CARE MOUTH RINSE: 15.0000 mL | Freq: Once | OROMUCOSAL | Status: AC

## 2021-11-08 MED ORDER — CITALOPRAM HYDROBROMIDE 20 MG PO TABS
20.0000 mg | ORAL_TABLET | Freq: Every day | ORAL | Status: DC
  Administered 2021-11-08: 20 mg via ORAL
  Filled 2021-11-08: qty 1

## 2021-11-08 MED ORDER — POLYVINYL ALCOHOL 1.4 % OP SOLN
1.0000 [drp] | OPHTHALMIC | Status: DC | PRN
  Administered 2021-11-08: 1 [drp] via OPHTHALMIC
  Filled 2021-11-08: qty 15

## 2021-11-08 MED ORDER — BUPIVACAINE-EPINEPHRINE (PF) 0.5% -1:200000 IJ SOLN
INTRAMUSCULAR | Status: DC | PRN
  Administered 2021-11-08: 10 mL

## 2021-11-08 MED ORDER — ONDANSETRON HCL 4 MG PO TABS: 4.0000 mg | ORAL_TABLET | Freq: Four times a day (QID) | ORAL | Status: DC | PRN

## 2021-11-08 MED ORDER — PANTOPRAZOLE SODIUM 40 MG PO TBEC
40.0000 mg | DELAYED_RELEASE_TABLET | Freq: Every day | ORAL | Status: DC
  Administered 2021-11-09: 40 mg via ORAL
  Filled 2021-11-08: qty 1

## 2021-11-08 MED ORDER — ACETAMINOPHEN 325 MG PO TABS: 650.0000 mg | ORAL_TABLET | ORAL | Status: DC | PRN

## 2021-11-08 MED ORDER — GABAPENTIN 100 MG PO CAPS
200.0000 mg | ORAL_CAPSULE | Freq: Three times a day (TID) | ORAL | Status: DC
  Administered 2021-11-08 – 2021-11-09 (×2): 200 mg via ORAL
  Filled 2021-11-08 (×2): qty 2

## 2021-11-08 MED ORDER — MICROFIBRILLAR COLL HEMOSTAT EX POWD
CUTANEOUS | Status: AC
  Filled 2021-11-08: qty 5

## 2021-11-08 MED ORDER — SUGAMMADEX SODIUM 200 MG/2ML IV SOLN
INTRAVENOUS | Status: DC | PRN
  Administered 2021-11-08: 200 mg via INTRAVENOUS

## 2021-11-08 MED ORDER — FENTANYL CITRATE (PF) 100 MCG/2ML IJ SOLN
25.0000 ug | INTRAMUSCULAR | Status: DC | PRN
  Administered 2021-11-08: 12.5 ug via INTRAVENOUS

## 2021-11-08 MED ORDER — METHOCARBAMOL 1000 MG/10ML IJ SOLN
500.0000 mg | Freq: Four times a day (QID) | INTRAVENOUS | Status: DC | PRN
  Filled 2021-11-08: qty 5

## 2021-11-08 MED ORDER — FENTANYL CITRATE (PF) 250 MCG/5ML IJ SOLN
INTRAMUSCULAR | Status: AC
  Filled 2021-11-08: qty 5

## 2021-11-08 MED ORDER — BUPROPION HCL ER (XL) 150 MG PO TB24
150.0000 mg | ORAL_TABLET | Freq: Every day | ORAL | Status: DC
  Administered 2021-11-09: 150 mg via ORAL
  Filled 2021-11-08: qty 1

## 2021-11-08 MED ORDER — THROMBIN 5000 UNITS EX SOLR
CUTANEOUS | Status: AC
  Filled 2021-11-08: qty 5000

## 2021-11-08 MED ORDER — DOCUSATE SODIUM 100 MG PO CAPS
100.0000 mg | ORAL_CAPSULE | Freq: Two times a day (BID) | ORAL | Status: DC
  Administered 2021-11-08 – 2021-11-09 (×3): 100 mg via ORAL
  Filled 2021-11-08 (×3): qty 1

## 2021-11-08 MED ORDER — BUPIVACAINE-EPINEPHRINE 0.5% -1:200000 IJ SOLN
INTRAMUSCULAR | Status: AC
  Filled 2021-11-08: qty 1

## 2021-11-08 MED ORDER — OXYCODONE HCL 5 MG PO TABS: 5.0000 mg | ORAL_TABLET | ORAL | Status: DC | PRN

## 2021-11-08 MED ORDER — PROPOFOL 10 MG/ML IV BOLUS
INTRAVENOUS | Status: AC
  Filled 2021-11-08: qty 20

## 2021-11-08 MED ORDER — MELATONIN 3 MG PO TABS: 3.0000 mg | ORAL_TABLET | Freq: Every evening | ORAL | Status: DC | PRN

## 2021-11-08 MED ORDER — CEFAZOLIN SODIUM-DEXTROSE 2-4 GM/100ML-% IV SOLN
2.0000 g | INTRAVENOUS | Status: AC
  Administered 2021-11-08: 2 g via INTRAVENOUS

## 2021-11-08 MED ORDER — METHYLPREDNISOLONE ACETATE 80 MG/ML IJ SUSP
INTRAMUSCULAR | Status: AC
  Filled 2021-11-08: qty 1

## 2021-11-08 MED ORDER — ONDANSETRON HCL 4 MG/2ML IJ SOLN
INTRAMUSCULAR | Status: AC
  Filled 2021-11-08: qty 2

## 2021-11-08 MED ORDER — ONDANSETRON HCL 4 MG/2ML IJ SOLN
INTRAMUSCULAR | Status: DC | PRN
  Administered 2021-11-08: 4 mg via INTRAVENOUS

## 2021-11-08 MED ORDER — FENTANYL CITRATE (PF) 100 MCG/2ML IJ SOLN
INTRAMUSCULAR | Status: AC
  Filled 2021-11-08: qty 2

## 2021-11-08 MED ORDER — STERILE WATER FOR IRRIGATION IR SOLN
Status: DC | PRN
  Administered 2021-11-08: 1000 mL

## 2021-11-08 MED ORDER — LACTATED RINGERS IV SOLN: INTRAVENOUS | Status: DC

## 2021-11-08 MED ORDER — CHLORHEXIDINE GLUCONATE 0.12 % MT SOLN
OROMUCOSAL | Status: AC
  Administered 2021-11-08: 15 mL via OROMUCOSAL
  Filled 2021-11-08: qty 15

## 2021-11-08 MED ORDER — OXYCODONE HCL 5 MG PO TABS
10.0000 mg | ORAL_TABLET | ORAL | Status: DC | PRN
  Administered 2021-11-08 – 2021-11-09 (×4): 10 mg via ORAL
  Filled 2021-11-08 (×4): qty 2

## 2021-11-08 MED ORDER — MICROFIBRILLAR COLL HEMOSTAT EX POWD
CUTANEOUS | Status: DC | PRN
  Administered 2021-11-08: 1 g via TOPICAL

## 2021-11-08 MED ORDER — PHENYLEPHRINE HCL-NACL 20-0.9 MG/250ML-% IV SOLN
INTRAVENOUS | Status: DC | PRN
  Administered 2021-11-08: 30 ug/min via INTRAVENOUS

## 2021-11-08 MED ORDER — DEXAMETHASONE SODIUM PHOSPHATE 10 MG/ML IJ SOLN
INTRAMUSCULAR | Status: DC | PRN
Start: 2021-11-08 — End: 2021-11-08
  Administered 2021-11-08: 10 mg via INTRAVENOUS

## 2021-11-08 MED ORDER — METHYLPREDNISOLONE ACETATE 80 MG/ML IJ SUSP
INTRAMUSCULAR | Status: DC | PRN
  Administered 2021-11-08: 80 mg

## 2021-11-08 MED ORDER — FENTANYL CITRATE (PF) 100 MCG/2ML IJ SOLN
INTRAMUSCULAR | Status: DC | PRN
  Administered 2021-11-08: 50 ug via INTRAVENOUS

## 2021-11-08 MED ORDER — PHENOL 1.4 % MT LIQD: 1.0000 | OROMUCOSAL | Status: DC | PRN

## 2021-11-08 MED ORDER — KETOROLAC TROMETHAMINE 15 MG/ML IJ SOLN
7.5000 mg | Freq: Four times a day (QID) | INTRAMUSCULAR | Status: AC
  Administered 2021-11-08 – 2021-11-09 (×3): 7.5 mg via INTRAVENOUS
  Filled 2021-11-08 (×3): qty 1

## 2021-11-08 MED ORDER — LIDOCAINE-EPINEPHRINE 1 %-1:100000 IJ SOLN
INTRAMUSCULAR | Status: DC | PRN
  Administered 2021-11-08: 20 mL

## 2021-11-08 MED ORDER — THROMBIN 5000 UNITS EX SOLR: OROMUCOSAL | Status: DC | PRN

## 2021-11-08 MED ORDER — MORPHINE SULFATE (PF) 2 MG/ML IV SOLN: 2.0000 mg | INTRAVENOUS | Status: DC | PRN

## 2021-11-08 MED ORDER — SODIUM CHLORIDE 0.9% FLUSH: 3.0000 mL | INTRAVENOUS | Status: DC | PRN

## 2021-11-08 MED ORDER — PHENYLEPHRINE 40 MCG/ML (10ML) SYRINGE FOR IV PUSH (FOR BLOOD PRESSURE SUPPORT)
PREFILLED_SYRINGE | INTRAVENOUS | Status: DC | PRN
  Administered 2021-11-08 (×2): 80 ug via INTRAVENOUS

## 2021-11-08 MED ORDER — CEFAZOLIN SODIUM-DEXTROSE 2-4 GM/100ML-% IV SOLN
INTRAVENOUS | Status: AC
  Filled 2021-11-08: qty 100

## 2021-11-08 MED ORDER — METHOCARBAMOL 500 MG PO TABS
500.0000 mg | ORAL_TABLET | Freq: Four times a day (QID) | ORAL | Status: DC | PRN
  Administered 2021-11-08: 500 mg via ORAL
  Filled 2021-11-08: qty 1

## 2021-11-08 MED ORDER — MELATONIN 3 MG PO TABS: 3.0000 mg | ORAL_TABLET | Freq: Every day | ORAL | Status: DC

## 2021-11-08 MED ORDER — CHLORHEXIDINE GLUCONATE 0.12 % MT SOLN: 15.0000 mL | Freq: Once | OROMUCOSAL | Status: AC

## 2021-11-08 MED ORDER — SODIUM CHLORIDE 0.9 % IV SOLN
250.0000 mL | INTRAVENOUS | Status: DC
  Administered 2021-11-08: 250 mL via INTRAVENOUS

## 2021-11-08 MED ORDER — EST ESTROGENS-METHYLTEST 0.625-1.25 MG PO TABS: 1.0000 | ORAL_TABLET | Freq: Every day | ORAL | Status: DC

## 2021-11-08 MED ORDER — CEFAZOLIN SODIUM-DEXTROSE 2-4 GM/100ML-% IV SOLN
2.0000 g | Freq: Three times a day (TID) | INTRAVENOUS | Status: AC
  Administered 2021-11-08 (×2): 2 g via INTRAVENOUS
  Filled 2021-11-08 (×2): qty 100

## 2021-11-08 MED ORDER — MENTHOL 3 MG MT LOZG: 1.0000 | LOZENGE | OROMUCOSAL | Status: DC | PRN

## 2021-11-08 MED ORDER — PROPRANOLOL HCL ER 60 MG PO CP24
60.0000 mg | ORAL_CAPSULE | Freq: Every day | ORAL | Status: DC
  Administered 2021-11-09: 60 mg via ORAL
  Filled 2021-11-08: qty 1

## 2021-11-08 SURGICAL SUPPLY — 60 items
BAG COUNTER SPONGE SURGICOUNT (BAG) ×2 IMPLANT
BAND RUBBER #18 3X1/16 STRL (MISCELLANEOUS) ×4 IMPLANT
BUR CARBIDE MATCH 3.0 (BURR) ×2 IMPLANT
CARTRIDGE OIL MAESTRO DRILL (MISCELLANEOUS) ×1 IMPLANT
CNTNR URN SCR LID CUP LEK RST (MISCELLANEOUS) ×1 IMPLANT
CONT SPEC 4OZ STRL OR WHT (MISCELLANEOUS) ×2
COVER MAYO STAND STRL (DRAPES) ×2 IMPLANT
DECANTER SPIKE VIAL GLASS SM (MISCELLANEOUS) ×2 IMPLANT
DERMABOND ADVANCED (GAUZE/BANDAGES/DRESSINGS) ×1
DERMABOND ADVANCED .7 DNX12 (GAUZE/BANDAGES/DRESSINGS) ×1 IMPLANT
DIFFUSER DRILL AIR PNEUMATIC (MISCELLANEOUS) ×2 IMPLANT
DRAIN JACKSON RD 7FR 3/32 (WOUND CARE) IMPLANT
DRAPE C-ARM 42X72 X-RAY (DRAPES) ×2 IMPLANT
DRAPE LAPAROTOMY 100X72X124 (DRAPES) ×2 IMPLANT
DRAPE MICROSCOPE LEICA (MISCELLANEOUS) ×2 IMPLANT
DRAPE SURG 17X23 STRL (DRAPES) ×2 IMPLANT
DRSG OPSITE POSTOP 4X6 (GAUZE/BANDAGES/DRESSINGS) ×2 IMPLANT
DURAPREP 26ML APPLICATOR (WOUND CARE) ×2 IMPLANT
ELECT BLADE INSULATED 4IN (ELECTROSURGICAL) ×2
ELECT COATED BLADE 2.86 ST (ELECTRODE) ×2 IMPLANT
ELECT REM PT RETURN 9FT ADLT (ELECTROSURGICAL) ×2
ELECTRODE BLADE INSULATED 4IN (ELECTROSURGICAL) ×1 IMPLANT
ELECTRODE REM PT RTRN 9FT ADLT (ELECTROSURGICAL) ×1 IMPLANT
GAUZE 4X4 16PLY ~~LOC~~+RFID DBL (SPONGE) IMPLANT
GAUZE SPONGE 4X4 12PLY STRL (GAUZE/BANDAGES/DRESSINGS) ×2 IMPLANT
GLOVE SRG 8 PF TXTR STRL LF DI (GLOVE) ×1 IMPLANT
GLOVE SURG LTX SZ8 (GLOVE) ×2 IMPLANT
GLOVE SURG UNDER POLY LF SZ8 (GLOVE) ×2
GOWN STRL REUS W/ TWL LRG LVL3 (GOWN DISPOSABLE) ×1 IMPLANT
GOWN STRL REUS W/ TWL XL LVL3 (GOWN DISPOSABLE) ×2 IMPLANT
GOWN STRL REUS W/TWL 2XL LVL3 (GOWN DISPOSABLE) IMPLANT
GOWN STRL REUS W/TWL LRG LVL3 (GOWN DISPOSABLE) ×2
GOWN STRL REUS W/TWL XL LVL3 (GOWN DISPOSABLE) ×4
HEMOSTAT POWDER KIT SURGIFOAM (HEMOSTASIS) ×2 IMPLANT
HEMOSTATIC FLOUR AVITENE 5GM (HEMOSTASIS) ×2 IMPLANT
KIT BASIN OR (CUSTOM PROCEDURE TRAY) ×2 IMPLANT
KIT POSITION SURG JACKSON T1 (MISCELLANEOUS) ×2 IMPLANT
KIT TURNOVER KIT B (KITS) ×2 IMPLANT
MARKER SKIN DUAL TIP RULER LAB (MISCELLANEOUS) ×2 IMPLANT
NEEDLE HYPO 18GX1.5 BLUNT FILL (NEEDLE) ×2 IMPLANT
NEEDLE HYPO 25X1 1.5 SAFETY (NEEDLE) ×2 IMPLANT
NS IRRIG 1000ML POUR BTL (IV SOLUTION) ×2 IMPLANT
OIL CARTRIDGE MAESTRO DRILL (MISCELLANEOUS) ×2
PACK LAMINECTOMY NEURO (CUSTOM PROCEDURE TRAY) ×2 IMPLANT
PAD ARMBOARD 7.5X6 YLW CONV (MISCELLANEOUS) ×8 IMPLANT
PATTIES SURGICAL .5 X.5 (GAUZE/BANDAGES/DRESSINGS) ×2 IMPLANT
PATTIES SURGICAL .5 X1 (DISPOSABLE) IMPLANT
PATTIES SURGICAL 1X1 (DISPOSABLE) ×2 IMPLANT
SPONGE SURGIFOAM ABS GEL 12-7 (HEMOSTASIS) ×2 IMPLANT
SPONGE T-LAP 4X18 ~~LOC~~+RFID (SPONGE) ×2 IMPLANT
SUT VIC AB 0 CT1 18XCR BRD8 (SUTURE) ×1 IMPLANT
SUT VIC AB 0 CT1 27 (SUTURE)
SUT VIC AB 0 CT1 27XBRD ANBCTR (SUTURE) IMPLANT
SUT VIC AB 0 CT1 8-18 (SUTURE) ×2
SUT VIC AB 2-0 CP2 18 (SUTURE) ×2 IMPLANT
SUT VIC AB 3-0 SH 8-18 (SUTURE) ×2 IMPLANT
SYR 3ML LL SCALE MARK (SYRINGE) ×2 IMPLANT
TOWEL GREEN STERILE (TOWEL DISPOSABLE) IMPLANT
TOWEL GREEN STERILE FF (TOWEL DISPOSABLE) IMPLANT
WATER STERILE IRR 1000ML POUR (IV SOLUTION) ×2 IMPLANT

## 2021-11-08 NOTE — Op Note (Signed)
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 Kendell Bane C. Favio Moder, DO   ASSISTANT: Docia Barrier, NP; Georgiann Cocker, RN   ANESTHESIA: General Endotracheal   EBL: 50 cc   SPECIMENS: None   DRAINS: None   COMPLICATIONS: None   CONDITION: Hemodynamically stable   HISTORY: Rebecca Glenn is a 86 y.o. female that presented with bilateral lower extremity weakness and difficulty walking with radiating pain into her anterior thighs and buttock bilaterally.  This was relieved when sitting down.  Her imaging revealed severe stenosis at L2-3 centrally due to ligamentum flavum hypertrophy and bilateral facet hypertrophy, moderate to severe stenosis at L3-4.  This was altering her lifestyle causing her difficulty walking for more than a few moments therefore I recommended surgical decompression in the form of an L2-3, L3-4 open laminectomy for decompression.  We discussed all risks, benefits and expected outcomes as well as alternatives to treatment.  I answered all of her questions.  Informed consent was obtained.   PROCEDURE IN DETAIL: The patient was brought to the operating room. After induction of general anesthesia, the patient was positioned on the operative table in the prone position. All pressure points were meticulously padded. Skin incision was then marked out and prepped and draped in the usual sterile fashion.   Using a 10 blade, midline incision was created over the L2, L3, L4 spinous processes.  Using Bovie electrocautery soft tissue dissection was performed down to the lumbodorsal fascia.  Subperiosteal dissection was performed bilaterally with Bovie electrocautery  exposing the L2, L3, L4 lamina bilaterally.  Deep retractors placed in the wound.  Lateral fluoroscopy confirmed the appropriate level.  The microscope was sterilely draped and brought into the field.  Using Leksell rongeur, the spinous process of L2, L3, L4 were removed down to the lamina.  Using a high-speed drill, the L2 lamina was removed bilaterally to the lateral recess down to the ligamentum flavum and superiorly up to the ligamentous attachment bilaterally.  Using high-speed drill, a partial medial bilateral facetectomy was performed down to the ligamentum flavum.  The ligamentum flavum was then gently dissected from the epidural space and resected with a series of Kerrison rongeurs to the lateral recess bilaterally.  There was significant calcification and hypertrophy bilaterally, right greater than left at this level.  Using a ball-tipped probe, bilateral lateral recesses appeared decompressed.  The bilateral lateral recess were explored again with a ball-tipped probe and noted to be appropriately decompressed.  The thecal sac was pulsatile.  Bilateral foramen were also palpated with a ball-tipped probe and noted to be adequately decompressed.    Using a high-speed drill, the L3 lamina was removed bilaterally to the lateral recess down to the ligamentum flavum and superiorly up to the ligamentous attachment bilaterally.  Using high-speed drill, a partial medial bilateral facetectomy was performed down to the ligamentum flavum.  Using a high-speed drill, the superior portion of the L4 lamina was removed down to the epidural space.  Using microcurette's, the epidural space was gently dissected free from the ligamentum flavum.  The ligamentum flavum was then resected with a series of Kerrison rongeurs to the lateral recess bilaterally.  There was significant ligamentum hypertrophy at this level.  Using a  ball-tipped probe, bilateral lateral recesses appeared decompressed.  The bilateral lateral recess were  explored again with a ball-tipped probe and noted to be appropriately decompressed.  The thecal sac was pulsatile.  Bilateral foramen were also palpated with a ball-tipped probe and noted to be adequately decompressed.    A mixture of Depo-Medrol and fentanyl was placed in the epidural space with Avitene. Deep retractor was taken out of the wound.  Hemostasis was achieved with bipolar cautery and the soft tissues.  The wound was closed in layers with 0 Vicryl sutures for muscle and fascia.  Dermis was closed with 2-0 and 3-0 Vicryl sutures.  Skin was closed with skin glue.  Sterile dressing was applied.   At the end of the case all sponge, needle, and instrument counts were correct. The patient was then transferred to the stretcher, extubated, and taken to the post-anesthesia care unit in stable hemodynamic condition.

## 2021-11-08 NOTE — Anesthesia Procedure Notes (Signed)
Procedure Name: Intubation Date/Time: 11/08/2021 7:46 AM Performed by: Myna Bright, CRNA Pre-anesthesia Checklist: Patient identified, Emergency Drugs available, Suction available and Patient being monitored Patient Re-evaluated:Patient Re-evaluated prior to induction Oxygen Delivery Method: Circle system utilized Preoxygenation: Pre-oxygenation with 100% oxygen Induction Type: IV induction Ventilation: Mask ventilation without difficulty Laryngoscope Size: Mac and 3 Grade View: Grade I Tube type: Oral Tube size: 7.0 mm Number of attempts: 1 Airway Equipment and Method: Stylet Placement Confirmation: ETT inserted through vocal cords under direct vision, positive ETCO2 and breath sounds checked- equal and bilateral Secured at: 21 cm Tube secured with: Tape Dental Injury: Teeth and Oropharynx as per pre-operative assessment

## 2021-11-08 NOTE — H&P (Signed)
Providing Compassionate, Quality Care - Together  NEUROSURGERY HISTORY & PHYSICAL   Rebecca Glenn is an 86 y.o. female.   Chief Complaint: Bilateral lower extremity pain HPI: This is an 86 year old female with a history of difficulty walking or standing for more than a few moments with severe bilateral thigh and buttock burning type pain.  This was relieved with sitting.  Imaging revealed severe stenosis at L3-4 and L2-3 consistent with neurogenic claudication.  Given her alteration of her lifestyle, she presents for surgical decompression today in the form of an L2-3, L3-4 decompressive laminectomy.  She denies any bowel or bladder changes.  She has been taking Percocet for pain control as this is the only thing that is helped.  Past Medical History:  Diagnosis Date   Arthritis    osteoarthritis   Blepharitis of both eyes    Complication of anesthesia    sever pain in lower back after spinal block 25 years ago   Depression    Diverticulosis    on colonoscopy   GERD (gastroesophageal reflux disease)    used to take nexium, takes tums   Headache    Hypertension    Hypothyroidism    Migraine    infrequent    Past Surgical History:  Procedure Laterality Date   ABDOMINAL HYSTERECTOMY     ANKLE SURGERY     tumor removed   BREAST IMPLANT REMOVAL Bilateral 01/04/2020   Procedure: REMOVAL BILATERAL BREAST IMPLANTS WITH CAPSULECTOMIES, REMOVAL RIGHT BREAST IMPLANT MATERIAL;  Surgeon: Glenna Fellows, MD;  Location: [facility];  Service: Plastics;  Laterality: Bilateral;   BREAST SURGERY     CATARACT EXTRACTION Bilateral    CHOLECYSTECTOMY     COLONOSCOPY     FOOT SURGERY Bilateral    foot fusion   SHOULDER SURGERY     arthritis in joint, removed part of bone?   spinal block     "long time ago"   TONSILLECTOMY     TOTAL KNEE ARTHROPLASTY Right    TOTAL KNEE ARTHROPLASTY Left 05/27/2016   Procedure: TOTAL KNEE ARTHROPLASTY;  Surgeon: Dannielle Huh, MD;   Location: [facility] OR;  Service: Orthopedics;  Laterality: Left;    History reviewed. No pertinent family history. Social History:  reports that she has quit smoking. She has never used smokeless tobacco. She reports current alcohol use. She reports that she does not use drugs.  Allergies:  Allergies  Allergen Reactions   Lactose Intolerance (Gi)    Cheese Rash   Ivp Dye [Iodinated Diagnostic Agents] Rash    Medications Prior to Admission  Medication Sig Dispense Refill   BIOTIN PO Take 1 tablet by mouth daily.     buPROPion (WELLBUTRIN XL) 150 MG 24 hr tablet Take 150 tablets by mouth daily.     citalopram (CELEXA) 20 MG tablet Take 20 mg by mouth at bedtime.     EST ESTROGENS-METHYLTEST HS 0.625-1.25 MG tablet Take 1 tablet by mouth daily.  4   gabapentin (NEURONTIN) 100 MG capsule Take 200 mg by mouth 3 (three) times daily.     lidocaine (LIDODERM) 5 % Place 1 patch onto the skin daily as needed (pain). Remove & Discard patch within 12 hours or as directed by MD     methocarbamol (ROBAXIN) 500 MG tablet Take 500 mg by mouth every 6 (six) hours as needed for muscle spasms.     Multiple Vitamin (MULTIVITAMIN WITH MINERALS) TABS tablet Take 1 tablet by mouth daily.  oxyCODONE-acetaminophen (PERCOCET) 10-325 MG tablet Take 1 tablet by mouth in the morning, at noon, and at bedtime.     propranolol ER (INDERAL LA) 60 MG 24 hr capsule Take 60 mg by mouth in the morning and at bedtime.     RABEprazole (ACIPHEX) 20 MG tablet Take 20 mg by mouth daily.     diclofenac Sodium (VOLTAREN) 1 % GEL Apply 2 g topically daily as needed (pain).     ondansetron (ZOFRAN) 4 MG tablet Take 1 tablet (4 mg total) by mouth every 6 (six) hours as needed for nausea. (Patient not taking: Reported on 11/01/2021) 20 tablet 0   oxyCODONE (OXY IR/ROXICODONE) 5 MG immediate release tablet Take 1-2 tablets (5-10 mg total) by mouth every 3 (three) hours as needed for breakthrough pain. (Patient not taking: Reported on  11/01/2021) 90 tablet 0    No results found for this or any previous visit (from the past 48 hour(s)). No results found.  ROS All positives and negatives listed in HPI above  Blood pressure (!) 157/67, pulse 62, temperature 97.9 F (36.6 C), temperature source Oral, resp. rate 17, height 5\' 7"  (1.702 m), weight 75.8 kg, SpO2 95 %. 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  We discussed all risks, benefits and expected outcomes and alternatives to treatment.  She agrees to proceed with surgical intervention.  Informed consent was obtained.    Thank you for allowing me to participate in this patient's care.  Please do not hesitate to call with questions or concerns.   97, DO Neurosurgeon [facility] Cell: [phone]

## 2021-11-08 NOTE — Transfer of Care (Signed)
Immediate Anesthesia Transfer of Care Note  Patient: Rebecca Glenn  Procedure(s) Performed: OPEN LAMINECTOMY, LUMBARTWO - LUMBAR THREE, LUMBAR THREE - LUMBAR FOUR (Spine Lumbar)  Patient Location: PACU  Anesthesia Type:General  Level of Consciousness: sedated, patient cooperative and responds to stimulation  Airway & Oxygen Therapy: Patient Spontanous Breathing and Patient connected to face mask oxygen  Post-op Assessment: Report given to RN, Post -op Vital signs reviewed and stable and Patient moving all extremities  Post vital signs: Reviewed and stable  Last Vitals:  Vitals Value Taken Time  BP 94/60 11/08/21 1003  Temp    Pulse 63 11/08/21 1006  Resp 9 11/08/21 1006  SpO2 86 % 11/08/21 1006  Vitals shown include unvalidated device data.  Last Pain:  Vitals:   11/08/21 0620  TempSrc:   PainSc: 0-No pain      Patients Stated Pain Goal: 2 (11/08/21 0722)  Complications: No notable events documented.

## 2021-11-08 NOTE — Anesthesia Postprocedure Evaluation (Signed)
Anesthesia Post Note  Patient: Rebecca Glenn  Procedure(s) Performed: OPEN LAMINECTOMY, LUMBARTWO - LUMBAR THREE, LUMBAR THREE - LUMBAR FOUR (Spine Lumbar)     Patient location during evaluation: PACU Anesthesia Type: General Level of consciousness: awake Pain management: pain level controlled Vital Signs Assessment: post-procedure vital signs reviewed and stable Cardiovascular status: stable Postop Assessment: no apparent nausea or vomiting Anesthetic complications: no   No notable events documented.  Last Vitals:  Vitals:   11/08/21 1103 11/08/21 1129  BP: 128/66 126/63  Pulse: 64 64  Resp: 17 20  Temp: 36.5 C   SpO2: 99% 95%    Last Pain:  Vitals:   11/08/21 1103  TempSrc:   PainSc: 0-No pain                 Zephaniah Lubrano

## 2021-11-09 ENCOUNTER — Encounter (HOSPITAL_COMMUNITY): Payer: Self-pay | Admitting: Neurological Surgery

## 2021-11-09 DIAGNOSIS — M48062 Spinal stenosis, lumbar region with neurogenic claudication: Secondary | ICD-10-CM | POA: Diagnosis not present

## 2021-11-09 MED ORDER — OXYCODONE-ACETAMINOPHEN 5-325 MG PO TABS: 1.0000 | ORAL_TABLET | Freq: Four times a day (QID) | ORAL | 0 refills | Status: AC | PRN

## 2021-11-09 NOTE — Discharge Summary (Signed)
Physician Discharge Summary  Patient ID: Rebecca Glenn MRN: [id number] DOB/AGE: 84-Jun-1936 84 y.o.  Admit date: 11/08/2021 Discharge date: 11/09/2021  Admission Diagnoses: L2-3, L3-4 lumbar stenosis with neurogenic claudication  Discharge Diagnoses: L2-3, L3-4 lumbar stenosis with neurogenic claudication Principal Problem:   Lumbar stenosis with neurogenic claudication   Discharged Condition: good  Hospital Course: The patient was admitted on 11/08/2021 and taken to the operating room where the patient underwent open bilateral L2, L3, L4 laminectomy for decompression neural elements. The patient tolerated the procedure well and was taken to the recovery room and then to the floor in stable condition. The hospital course was routine. There were no complications. The wound remained clean dry and intact. Pt had appropriate back soreness. No complaints of leg pain or new N/T/W. The patient remained afebrile with stable vital signs, and tolerated a regular diet. The patient continued to increase activities, and pain was well controlled with oral pain medications.   Consults: None  Significant Diagnostic Studies: radiology: X-Ray: intraoperative   Treatments: surgery:   1. Open bilateral L2, L3, L4 laminectomy for decompression neural elements 2. Intraoperative use of microscope for microdissection 3. Intraoperative use of fluoroscopy  Discharge Exam: Blood pressure (!) 115/52, pulse 66, temperature 98.6 F (37 C), temperature source Oral, resp. rate 18, height 5\' 7"  (1.702 m), weight 75.8 kg, SpO2 93 %.  Physical Exam: Patient is awake, A/O X 4, conversant, and in good spirits. They are in NAD and VSS. Doing well. Speech is fluent and appropriate. MAEW with good strength that is symmetric bilaterally. Sensation to light touch is intact. PERLA, EOMI. CNs grossly intact. Dressing is clean dry intact. Incision is well approximated with no drainage, erythema, or edema.    Disposition:  Discharge disposition: 01-Home or Self Care       Discharge Instructions     Incentive spirometry RT   Complete by: As directed       Allergies as of 11/09/2021       Reactions   Lactose Intolerance (gi)    Cheese Rash   Ivp Dye [iodinated Diagnostic Agents] Rash        Medication List     STOP taking these medications    oxyCODONE-acetaminophen 10-325 MG tablet Commonly known as: PERCOCET Replaced by: oxyCODONE-acetaminophen 5-325 MG tablet       TAKE these medications    BIOTIN PO Take 1 tablet by mouth daily.   buPROPion 150 MG 24 hr tablet Commonly known as: WELLBUTRIN XL Take 150 mg by mouth daily.   citalopram 20 MG tablet Commonly known as: CELEXA Take 20 mg by mouth at bedtime.   diclofenac Sodium 1 % Gel Commonly known as: VOLTAREN Apply 2 g topically daily as needed (pain).   Est Estrogens-Methyltest HS 0.625-1.25 MG tablet Generic drug: estrogen-methylTESTOSTERone Take 1 tablet by mouth daily.   gabapentin 100 MG capsule Commonly known as: NEURONTIN Take 200 mg by mouth 3 (three) times daily.   lidocaine 5 % Commonly known as: LIDODERM Place 1 patch onto the skin daily as needed (pain). Remove & Discard patch within 12 hours or as directed by MD   methocarbamol 500 MG tablet Commonly known as: ROBAXIN Take 500 mg by mouth every 6 (six) hours as needed for muscle spasms.   multivitamin with minerals Tabs tablet Take 1 tablet by mouth daily.   oxyCODONE-acetaminophen 5-325 MG tablet Commonly known as: Percocet Take 1-2 tablets by mouth every 6 (six) hours as needed for severe pain. Replaces: oxyCODONE-acetaminophen  10-325 MG tablet   propranolol ER 60 MG 24 hr capsule Commonly known as: INDERAL LA Take 60 mg by mouth in the morning and at bedtime.   RABEprazole 20 MG tablet Commonly known as: ACIPHEX Take 20 mg by mouth daily.         Signed: Council Mechanic, DNP, NP-C 11/09/2021, 8:42 AM

## 2021-11-09 NOTE — Discharge Instructions (Signed)
Wound Care Remove outer dressing in 2-3 days Leave incision open to air. You may shower. Do not scrub directly on incision.  Do not put any creams, lotions, or ointments on incision. Activity Walk each and every day, increasing distance each day. No lifting greater than 5 lbs.  Avoid bending, Lifting, and twisting. No driving for 2 weeks; may ride as a passenger locally.  Diet Resume your normal diet.  Return to Work Will be discussed at you follow up appointment. Call Your Doctor If Any of These Occur Redness, drainage, or swelling at the wound.  Temperature greater than 101 degrees. Severe pain not relieved by pain medication. Incision starts to come apart. Follow Up Appt Call today for appointment in 2 weeks ([phone]) or for problems.

## 2021-11-09 NOTE — Evaluation (Signed)
Occupational Therapy Evaluation Patient Details Name: Rebecca Glenn MRN: [id number] DOB: 1935/01/27 Today's Date: 11/09/2021   History of Present Illness Pt is an 86 y/o female who presents s/p L2-L4 laminectomy/decompression on 11/08/2021. PMH significant for HTN, hypothyroidism, shoulder surgery, B TKR.   Clinical Impression   PTA, pt was living alone and was independent; daughter staying at dc. Currently, pt performing at Supervision level for ADLs and functional mobility using RW. Provided education and handout on back precautions, bed mobility, LB ADLs, toileting, and tub transfer with shower seat; pt demonstrated understanding. Pt would benefit from further acute OT to facilitate safe dc. Recommend dc to home once medically stable per physician.      Recommendations for follow up therapy are one component of a multi-disciplinary discharge planning process, led by the attending physician.  Recommendations may be updated based on patient status, additional functional criteria and insurance authorization.   Follow Up Recommendations  No OT follow up    Assistance Recommended at Discharge Intermittent Supervision/Assistance  Functional Status Assessment  Patient has had a recent decline in their functional status and demonstrates the ability to make significant improvements in function in a reasonable and predictable amount of time.  Equipment Recommendations  None recommended by OT    Recommendations for Other Services       Precautions / Restrictions Precautions Precautions: Back Precaution Booklet Issued: Yes (comment) Precaution Comments: Reviewing back precautions Required Braces or Orthoses: Other Brace Other Brace: No brace Restrictions Weight Bearing Restrictions: No      Mobility Bed Mobility Overal bed mobility: Needs Assistance Bed Mobility: Rolling;Sidelying to Sit Rolling: Supervision Sidelying to sit: Supervision       General bed mobility comments: Education  on log roll technique. Supervision for safety    Transfers Overall transfer level: Needs assistance Equipment used: Rolling walker (2 wheels) Transfers: Sit to/from Stand Sit to Stand: Supervision           General transfer comment: Supervision for safety      Balance Overall balance assessment: No apparent balance deficits (not formally assessed)                                         ADL either performed or assessed with clinical judgement   ADL Overall ADL's : Needs assistance/impaired                                       General ADL Comments: Pt performing ADLs and functional mobility at Supervision level. Providing education and handout on back precautions, bed mobility, LB ADLs, toileting, grooming, and tub transfer. Pt very motivated to participate     Vision         Perception     Praxis      Pertinent Vitals/Pain Pain Assessment: Faces Faces Pain Scale: Hurts little more Pain Location: back Pain Descriptors / Indicators: Discomfort;Grimacing Pain Intervention(s): Monitored during session;Limited activity within patient's tolerance;Repositioned     Hand Dominance Right   Extremity/Trunk Assessment Upper Extremity Assessment Upper Extremity Assessment: Overall WFL for tasks assessed   Lower Extremity Assessment Lower Extremity Assessment: Defer to PT evaluation   Cervical / Trunk Assessment Cervical / Trunk Assessment: Back Surgery   Communication Communication Communication: No difficulties   Cognition Arousal/Alertness: Awake/alert Behavior During Therapy: WFL for tasks assessed/performed  Overall Cognitive Status: Within Functional Limits for tasks assessed                                       General Comments       Exercises     Shoulder Instructions      Home Living Family/patient expects to be discharged to:: Private residence Living Arrangements: Children Available Help at  Discharge: Family;Available 24 hours/day Type of Home: House Home Access: Stairs to enter     Home Layout: One level     Bathroom Shower/Tub: Chief Strategy Officer: Standard     Home Equipment: Agricultural consultant (2 wheels);Shower seat;Grab bars - tub/shower          Prior Functioning/Environment Prior Level of Function : Independent/Modified Independent                        OT Problem List: Decreased strength;Decreased range of motion;Decreased activity tolerance;Impaired balance (sitting and/or standing);Decreased knowledge of use of DME or AE;Decreased knowledge of precautions      OT Treatment/Interventions: Self-care/ADL training;Therapeutic exercise;Energy conservation;DME and/or AE instruction;Therapeutic activities;Patient/family education    OT Goals(Current goals can be found in the care plan section) Acute Rehab OT Goals Patient Stated Goal: Go home OT Goal Formulation: With patient Time For Goal Achievement: 11/23/21 Potential to Achieve Goals: Good  OT Frequency: Min 2X/week   Barriers to D/C:            Co-evaluation              AM-PAC OT "6 Clicks" Daily Activity     Outcome Measure Help from another person eating meals?: None Help from another person taking care of personal grooming?: A Little Help from another person toileting, which includes using toliet, bedpan, or urinal?: A Little Help from another person bathing (including washing, rinsing, drying)?: A Little Help from another person to put on and taking off regular upper body clothing?: A Little Help from another person to put on and taking off regular lower body clothing?: A Little 6 Click Score: 19   End of Session Equipment Utilized During Treatment: Gait belt Nurse Communication: Mobility status  Activity Tolerance: Patient tolerated treatment well Patient left: in chair;with call bell/phone within reach  OT Visit Diagnosis: Unsteadiness on feet  (R26.81);Other abnormalities of gait and mobility (R26.89);Muscle weakness (generalized) (M62.81);Pain Pain - part of body:  (back)                Time: 2706-2376 OT Time Calculation (min): 18 min Charges:  OT General Charges $OT Visit: 1 Visit OT Evaluation $OT Eval Low Complexity: 1 Low  Morgaine Kimball MSOT, OTR/L Acute Rehab Pager: [phone] Office: [phone]  Theodoro Grist Fraser Busche 11/09/2021, 8:52 AM

## 2021-11-09 NOTE — Progress Notes (Signed)
Patient alert and oriented, mae's well, voiding adequate amount of urine, swallowing without difficulty, no c/o pain at time of discharge. Patient discharged home with family. Script and discharged instructions given to patient. Patient and family stated understanding of instructions given. Patient has an appointment with Dr. Dawley 

## 2021-11-09 NOTE — Evaluation (Signed)
Physical Therapy Evaluation Patient Details Name: Rebecca Glenn MRN: [id number] DOB: 1935/01/31 Today's Date: 11/09/2021  History of Present Illness  Pt is an 86 y/o female who presents s/p L2-L4 laminectomy/decompression on 11/08/2021. PMH significant for HTN, hypothyroidism, shoulder surgery, B TKR.   Clinical Impression  Pt admitted with above diagnosis. At the time of PT eval, pt was able to demonstrate transfers and ambulation with gross min guard to min assist with up to [facility] for support. Pt was educated on precautions, positioning recommendations, appropriate activity progression, and car transfer. Pt currently with functional limitations due to the deficits listed below (see PT Problem List). Pt will benefit from skilled PT to increase their independence and safety with mobility to allow discharge to the venue listed below.         Recommendations for follow up therapy are one component of a multi-disciplinary discharge planning process, led by the attending physician.  Recommendations may be updated based on patient status, additional functional criteria and insurance authorization.  Follow Up Recommendations No PT follow up    Assistance Recommended at Discharge Intermittent Supervision/Assistance  Functional Status Assessment Patient has had a recent decline in their functional status and demonstrates the ability to make significant improvements in function in a reasonable and predictable amount of time.  Nurse, learning disability    Recommendations for Other Services       Precautions / Restrictions Precautions Precautions: Back Precaution Booklet Issued: Yes (comment) Precaution Comments: Reviewed handout and pt was cued for precautions during functional mobility. Required Braces or Orthoses: Other Brace Other Brace: No brace Restrictions Weight Bearing Restrictions: No      Mobility  Bed Mobility Overal bed mobility: Needs Assistance Bed Mobility: Rolling;Sit to  Sidelying Rolling: Supervision Sidelying to sit: Supervision     Sit to sidelying: Supervision General bed mobility comments: VC's for optimal log roll technique throughout. HOB flat to simulate home environment.    Transfers Overall transfer level: Needs assistance Equipment used: Rolling walker (2 wheels) Transfers: Sit to/from Stand Sit to Stand: Supervision           General transfer comment: Close supervision for safety as pt appeared mildly unsteady upon initial stand. No assist required to recover however.    Ambulation/Gait Ambulation/Gait assistance: Min assist;Min guard Gait Distance (Feet): 500 Feet Assistive device: None;Straight cane Gait Pattern/deviations: Step-through pattern;Decreased stride length;Trunk flexed Gait velocity: Decreased Gait velocity interpretation: 1.31 - 2.62 ft/sec, indicative of limited community ambulator   General Gait Details: VC's for improved posture and forward gaze. Occasonal min assist for balance support throughout gait training. SPC improved overall balance.  Stairs            Wheelchair Mobility    Modified Rankin (Stroke Patients Only)       Balance Overall balance assessment: Needs assistance Sitting-balance support: No upper extremity supported;Feet supported Sitting balance-Leahy Scale: Fair     Standing balance support: No upper extremity supported;During functional activity Standing balance-Leahy Scale: Poor Standing balance comment: Occasional LOB                             Pertinent Vitals/Pain Pain Assessment: Faces Faces Pain Scale: Hurts little more Pain Location: low back Pain Descriptors / Indicators: Discomfort;Grimacing;Operative site guarding Pain Intervention(s): Monitored during session;Limited activity within patient's tolerance;Repositioned    Home Living Family/patient expects to be discharged to:: Private residence Living Arrangements: Children Available Help at  Discharge: Family;Available 24 hours/day Type  of Home: House Home Access: Stairs to enter   [employer] of Steps: 2   Home Layout: One level Home Equipment: Agricultural consultant (2 wheels);Shower seat;Grab bars - tub/shower      Prior Function Prior Level of Function : Independent/Modified Independent                     Hand Dominance   Dominant Hand: Right    Extremity/Trunk Assessment   Upper Extremity Assessment Upper Extremity Assessment: Defer to OT evaluation    Lower Extremity Assessment Lower Extremity Assessment: Generalized weakness (Consistent with pre-op diagnosis)    Cervical / Trunk Assessment Cervical / Trunk Assessment: Back Surgery  Communication   Communication: No difficulties  Cognition Arousal/Alertness: Awake/alert Behavior During Therapy: WFL for tasks assessed/performed Overall Cognitive Status: Within Functional Limits for tasks assessed                                          General Comments      Exercises     Assessment/Plan    PT Assessment Patient needs continued PT services  PT Problem List Decreased strength;Decreased activity tolerance;Decreased mobility;Decreased balance;Decreased knowledge of use of DME;Decreased safety awareness;Decreased knowledge of precautions;Pain       PT Treatment Interventions DME instruction;Stair training;Gait training;Functional mobility training;Therapeutic activities;Therapeutic exercise;Neuromuscular re-education;Patient/family education    PT Goals (Current goals can be found in the Care Plan section)  Acute Rehab PT Goals Patient Stated Goal: Home today PT Goal Formulation: With patient Time For Goal Achievement: 11/16/21 Potential to Achieve Goals: Good    Frequency Min 5X/week   Barriers to discharge        Co-evaluation               AM-PAC PT "6 Clicks" Mobility  Outcome Measure Help needed turning from your back to your side while in a  flat bed without using bedrails?: A Little Help needed moving from lying on your back to sitting on the side of a flat bed without using bedrails?: A Little Help needed moving to and from a bed to a chair (including a wheelchair)?: A Little Help needed standing up from a chair using your arms (e.g., wheelchair or bedside chair)?: A Little Help needed to walk in hospital room?: A Little Help needed climbing 3-5 steps with a railing? : A Little 6 Click Score: 18    End of Session Equipment Utilized During Treatment: Gait belt Activity Tolerance: Patient tolerated treatment well Patient left: in bed;with call bell/phone within reach Nurse Communication: Mobility status PT Visit Diagnosis: Unsteadiness on feet (R26.81);Pain Pain - part of body:  (back)    Time: 7353-2992 PT Time Calculation (min) (ACUTE ONLY): 19 min   Charges:   PT Evaluation $PT Eval Low Complexity: 1 Low          Conni Slipper, PT, DPT Acute Rehabilitation Services Pager: [phone] Office: [phone]   Marylynn Pearson 11/09/2021, 9:28 AM

## 2021-11-19 ENCOUNTER — Encounter (HOSPITAL_COMMUNITY): Payer: Self-pay | Admitting: Neurological Surgery
